# Patient Record
Sex: Female | Born: 1955 | Race: White | Hispanic: No | Marital: Married | Smoking: Never smoker
Health system: Southern US, Community
[De-identification: ages and names within clinical notes are randomized; demographics above are authoritative.]

## PROBLEM LIST (undated history)

## (undated) DIAGNOSIS — M797 Fibromyalgia: Secondary | ICD-10-CM

## (undated) DIAGNOSIS — E119 Type 2 diabetes mellitus without complications: Secondary | ICD-10-CM

## (undated) DIAGNOSIS — G8929 Other chronic pain: Secondary | ICD-10-CM

## (undated) DIAGNOSIS — C50919 Malignant neoplasm of unspecified site of unspecified female breast: Secondary | ICD-10-CM

## (undated) DIAGNOSIS — F419 Anxiety disorder, unspecified: Secondary | ICD-10-CM

## (undated) HISTORY — PX: BLADDER REMOVAL: SHX567

## (undated) HISTORY — PX: MASTECTOMY: SHX3

## (undated) HISTORY — PX: ABDOMINAL HYSTERECTOMY: SHX81

## (undated) HISTORY — PX: APPENDECTOMY: SHX54

---

## 2015-05-19 ENCOUNTER — Encounter: Payer: Self-pay | Admitting: Emergency Medicine

## 2015-05-19 ENCOUNTER — Emergency Department

## 2015-05-19 ENCOUNTER — Emergency Department
Admission: EM | Admit: 2015-05-19 | Discharge: 2015-05-20 | Disposition: A | Discharge status: Home / Self Care | Attending: Emergency Medicine | Admitting: Emergency Medicine

## 2015-05-19 DIAGNOSIS — Y832 Surgical operation with anastomosis, bypass or graft as the cause of abnormal reaction of the patient, or of later complication, without mention of misadventure at the time of the procedure: Secondary | ICD-10-CM | POA: Insufficient documentation

## 2015-05-19 DIAGNOSIS — E160 Drug-induced hypoglycemia without coma: Secondary | ICD-10-CM | POA: Insufficient documentation

## 2015-05-19 DIAGNOSIS — T82898A Other specified complication of vascular prosthetic devices, implants and grafts, initial encounter: Secondary | ICD-10-CM | POA: Diagnosis present

## 2015-05-19 DIAGNOSIS — T383X1A Poisoning by insulin and oral hypoglycemic [antidiabetic] drugs, accidental (unintentional), initial encounter: Secondary | ICD-10-CM

## 2015-05-19 DIAGNOSIS — E11649 Type 2 diabetes mellitus with hypoglycemia without coma: Secondary | ICD-10-CM | POA: Diagnosis not present

## 2015-05-19 DIAGNOSIS — Z79899 Other long term (current) drug therapy: Secondary | ICD-10-CM | POA: Diagnosis not present

## 2015-05-19 DIAGNOSIS — T370X5A Adverse effect of sulfonamides, initial encounter: Secondary | ICD-10-CM | POA: Diagnosis not present

## 2015-05-19 DIAGNOSIS — R404 Transient alteration of awareness: Secondary | ICD-10-CM

## 2015-05-19 HISTORY — DX: Fibromyalgia: M79.7

## 2015-05-19 HISTORY — DX: Other chronic pain: G89.29

## 2015-05-19 HISTORY — DX: Malignant neoplasm of unspecified site of unspecified female breast: C50.919

## 2015-05-19 HISTORY — DX: Type 2 diabetes mellitus without complications: E11.9

## 2015-05-19 HISTORY — DX: Anxiety disorder, unspecified: F41.9

## 2015-05-19 LAB — URINALYSIS COMPLETE WITH MICROSCOPIC (ARMC ONLY)
Bilirubin Urine: NEGATIVE
Glucose, UA: NEGATIVE mg/dL
HGB URINE DIPSTICK: NEGATIVE
KETONES UR: NEGATIVE mg/dL
LEUKOCYTES UA: NEGATIVE
Nitrite: POSITIVE — AB
PH: 7 (ref 5.0–8.0)
Protein, ur: NEGATIVE mg/dL
Specific Gravity, Urine: 1.004 — ABNORMAL LOW (ref 1.005–1.030)
Squamous Epithelial / LPF: NONE SEEN

## 2015-05-19 LAB — COMPREHENSIVE METABOLIC PANEL
ALBUMIN: 4.1 g/dL (ref 3.5–5.0)
ALT: 20 U/L (ref 14–54)
ANION GAP: 7 (ref 5–15)
AST: 26 U/L (ref 15–41)
Alkaline Phosphatase: 65 U/L (ref 38–126)
BUN: 11 mg/dL (ref 6–20)
CALCIUM: 9 mg/dL (ref 8.9–10.3)
CO2: 28 mmol/L (ref 22–32)
Chloride: 102 mmol/L (ref 101–111)
Creatinine, Ser: 0.74 mg/dL (ref 0.44–1.00)
GFR calc non Af Amer: >60 mL/min (ref >60)
GLUCOSE: 53 mg/dL — AB (ref 65–99)
Potassium: 4.3 mmol/L (ref 3.5–5.1)
Sodium: 137 mmol/L (ref 135–145)
Total Bilirubin: 0.6 mg/dL (ref 0.3–1.2)
Total Protein: 7.4 g/dL (ref 6.5–8.1)

## 2015-05-19 LAB — CBC WITH DIFFERENTIAL/PLATELET
BASOS ABS: 0 10*3/uL (ref 0–0.1)
Basophils Relative: 1 %
Eosinophils Absolute: 0.4 10*3/uL (ref 0–0.7)
Eosinophils Relative: 4 %
HCT: 41.6 % (ref 35.0–47.0)
HEMOGLOBIN: 14 g/dL (ref 12.0–16.0)
Lymphocytes Relative: 32 %
Lymphs Abs: 2.9 10*3/uL (ref 1.0–3.6)
MCH: 28.2 pg (ref 26.0–34.0)
MCHC: 33.6 g/dL (ref 32.0–36.0)
MCV: 84.1 fL (ref 80.0–100.0)
Monocytes Absolute: 0.6 10*3/uL (ref 0.2–0.9)
Monocytes Relative: 7 %
Neutro Abs: 5 10*3/uL (ref 1.4–6.5)
Neutrophils Relative %: 56 %
Platelets: 250 10*3/uL (ref 150–440)
RBC: 4.94 MIL/uL (ref 3.80–5.20)
RDW: 14 % (ref 11.5–14.5)
WBC: 8.9 10*3/uL (ref 3.6–11.0)

## 2015-05-19 LAB — AMMONIA

## 2015-05-19 LAB — GLUCOSE, CAPILLARY: Glucose-Capillary: 48 mg/dL — ABNORMAL LOW (ref 65–99)

## 2015-05-19 NOTE — ED Notes (Signed)
Pt presents to ER alert and in NAD. Pt is currently seen by hospice and had her PICC line accidently removed today. During triage family also states they want blood tests since pt has had periods of AMS.

## 2015-05-19 NOTE — ED Notes (Signed)
Pt. Has foley catheter in place.

## 2015-05-19 NOTE — ED Notes (Signed)
MD at bedside. 

## 2015-05-19 NOTE — ED Notes (Signed)
Pt. Is here under hospice care.  Pt. Is on a Dilaudid pump.  Pt. Is a breast CA pt.  Pt. Is here today for altered mental status.  Pt. picc line came out of pt. Lt. Arm.

## 2015-05-19 NOTE — ED Provider Notes (Signed)
[facility] Emergency Department Provider Note  ____________________________________________  Time seen: Approximately 910PM  I have reviewed the triage vital signs and the nursing notes.   HISTORY  Chief Complaint Vascular Access Problem   HPI Nancy Zuniga is a 59 y.o. female with a history of metastatic breast cancer as well as a remote history of bladder cancer, on hospice, who presents with worsening altered mental status over the past 1.5 weeks as well as a pulled PICC line by accident at home today.Over the past 1.5 weeks the patient has been having increased confusion as well as difficulty remembering. Prior to the last week and a half she has been alert and oriented 4 but over the past week and a half she has been more forgetful. The husband says that she has had a decline since a kidney infection this past June. She denies any pain at this time. No nausea vomiting or diarrhea. Able to eat today. No complaints of pain and bleeding from the PICC site. Is using the PICC at home for a Dilaudid infusion secondary to her metastatic cancer. No known brain metastases. However, does have known metastases to the liver. Patient on baseline Macrobid.   Past Medical History  Diagnosis Date  . Breast cancer   . Diabetes mellitus without complication   . Anxiety   . Chronic pain   . Fibromyalgia     There are no active problems to display for this patient.   Past Surgical History  Procedure Laterality Date  . Bladder removal    . Mastectomy    . Appendectomy    . Abdominal hysterectomy      No current outpatient prescriptions on file.  Allergies Ciprofloxacin; Codeine; Iodine; Morphine and related; and Tape  History reviewed. No pertinent family history.  Social History History  Substance Use Topics  . Smoking status: Never Smoker   . Smokeless tobacco: Not on file  . Alcohol Use: No    Review of Systems Constitutional: No  fever/chills Eyes: No visual changes. ENT: No sore throat. Cardiovascular: Denies chest pain. Respiratory: Denies shortness of breath. Gastrointestinal: No abdominal pain.  No nausea, no vomiting.  No diarrhea.  No constipation. Genitourinary: Negative for dysuria. Musculoskeletal: Negative for back pain. Skin: Negative for rash. Neurological: Negative for headaches, focal weakness or numbness.  10-point ROS otherwise negative.  ____________________________________________   PHYSICAL EXAM:  VITAL SIGNS: ED Triage Vitals  Enc Vitals Group     BP 05/19/15 2029 102/56 mmHg     Pulse Rate 05/19/15 2029 90     Resp 05/19/15 2029 20     Temp 05/19/15 2029 98.6 F (37 C)     Temp Source 05/19/15 2029 Oral     SpO2 05/19/15 2029 95 %     Weight 05/19/15 2029 175 lb (79.379 kg)     Height 05/19/15 2029 5\' 2"  (1.575 m)     Head Cir --      Peak Flow --      Pain Score 05/19/15 2030 2     Pain Loc --      Pain Edu? --      Excl. in [place]? --     Constitutional: Alert and oriented. Well appearing and in no acute distress. Eyes: Conjunctivae are normal. PERRL. EOMI. Head: Atraumatic. Nose: No congestion/rhinnorhea. Mouth/Throat: Mucous membranes are moist.  Oropharynx non-erythematous. Neck: No stridor.   Cardiovascular: Normal rate, regular rhythm. Grossly normal heart sounds.  Good peripheral circulation. Respiratory: Normal respiratory  effort.  No retractions. Lungs CTAB. Gastrointestinal: Soft and nontender. No distention. No abdominal bruits. No CVA tenderness. Urostomy bag to the right lower quadrant of the abdomen. Clear yellow urine in the bag. Musculoskeletal: No lower extremity tenderness nor edema.  No joint effusions. Left upper extremity at PICC line site without any erythema, pus or tenderness palpation at site of PICC insertion. Neurologic:  Normal speech and language. No gross focal neurologic deficits are appreciated. No gait instability. Skin:  Skin is warm, dry and  intact. No rash noted. Psychiatric: Mood and affect are normal. Speech and behavior are normal.  ____________________________________________   LABS (all labs ordered are listed, but only abnormal results are displayed)  Labs Reviewed  URINALYSIS COMPLETEWITH MICROSCOPIC ([place]) - Abnormal; Notable for the following:    Color, Urine YELLOW (*)    APPearance CLEAR (*)    Specific Gravity, Urine 1.004 (*)    Nitrite POSITIVE (*)    Bacteria, UA RARE (*)    All other components within normal limits  URINE CULTURE  CBC WITH DIFFERENTIAL/PLATELET  COMPREHENSIVE METABOLIC PANEL  AMMONIA   ____________________________________________  EKG   ____________________________________________  RADIOLOGY No active cardiopulmonary disease on the chest x-ray personally reviewed these images.  ____________________________________________   PROCEDURES    ____________________________________________   INITIAL IMPRESSION / ASSESSMENT AND PLAN / ED COURSE  Pertinent labs & imaging results that were available during my care of the patient were reviewed by me and considered in my medical decision making (see chart for details).  ----------------------------------------- 9:38 PM on 05/19/2015 -----------------------------------------  Discussed with the patient's hospice nurse, Jeannine Kitten, who says that she will check with the hospice house to see if they will be able to take the patient until a new PICC line can be placed after the weekend.  ----------------------------------------- 10:20 PM on 05/19/2015 -----------------------------------------  Patient now saying she does not one the PICC line. Has discussed this with her husband. I discussed this with the patient and her husband saying that this means that they would not be able to have the Dilaudid infusion for pain control. The patient says that she does not think that Dilaudid was helping much with her pain and will continue  with her by mouth methadone. At this time the patient is still pending her lab results. Likely to be dispositioned to home. Signed out to Dr. Jacqualine Code. ____________________________________________   FINAL CLINICAL IMPRESSION(S) / ED DIAGNOSES  Acute altered mental status. Acute removed PICC. Initial visit.    Orbie Pyo, MD 05/19/15 2222

## 2015-05-19 NOTE — ED Notes (Signed)
Patient transported to X-ray 

## 2015-05-19 NOTE — ED Provider Notes (Signed)
Patient's blood sugar noted to be less than 50. This may very well be causing altered mental status. She is currently eating crackers and a sandwich, and when I see her she is awake and alert and mental status is improved.  She is notably on sulfonylurea, and we will admit her for ongoing observation as we continue to manage her hypoglycemia. In addition, hospice has been contacted and is attempting to arrange a bed at the hospice home but has not yet been able to open a bed.  Admitted Hypoglycemia, sulfonylurea  Delman Kitten, MD 05/19/15 [phone]

## 2015-05-20 ENCOUNTER — Encounter: Payer: Self-pay | Admitting: Internal Medicine

## 2015-05-20 LAB — GLUCOSE, CAPILLARY: Glucose-Capillary: 136 mg/dL — ABNORMAL HIGH (ref 65–99)

## 2015-05-20 MED ORDER — HYDROMORPHONE HCL 1 MG/ML IJ SOLN
0.5000 mg | Freq: Once | INTRAMUSCULAR | Status: AC
  Administered 2015-05-20: 0.5 mg via INTRAVENOUS

## 2015-05-20 MED ORDER — HYDROMORPHONE HCL 1 MG/ML IJ SOLN
INTRAMUSCULAR | Status: AC
  Administered 2015-05-20: 0.5 mg via INTRAVENOUS
  Filled 2015-05-20: qty 1

## 2015-05-20 NOTE — ED Notes (Signed)
Pt. Going home with following up with hospice in morning.  Pt. And husband have # to hospice to call tonight if pain gets out of control.

## 2015-05-20 NOTE — Consult Note (Signed)
Reason for Consult: Hypoglycemia Referring Physician: Charolett Bumpers, MD  Nancy Zuniga is an 59 y.o. female.  HPI: The patient presents to the hospital with confusion. Past medical history is significant for stage IV breast cancer and the patient had been under hospice care at home with a Dilaudid drip by PICC line. According to her husband, the patient became agitated and confused at home and removed her PICC line. They were concerned about her access for pain control and presented to the emergency department where she was also found to be hypoglycemic. The patient displayed confusion at some point but she was never unconscious or unresponsive. She did not require D50 infusion. Initially the emergency department staff called for admission secondary to sulfonylurea-induced hypoglycemia but the patient is very reluctant for admission as her priority is to have end-of-life care according to her own wishes.The patient ate a sandwich and had some soda which improved her blood sugar to 136. Notably, the patient took methadone prior to presentation to the emergency department. Confusion was resolved by the time of my interview. She states that she does not want replacement of her PICC line.  Past Medical History  Diagnosis Date  . Breast cancer   . Diabetes mellitus without complication   . Anxiety   . Chronic pain   . Fibromyalgia     Past Surgical History  Procedure Laterality Date  . Bladder removal    . Mastectomy Right   . Appendectomy    . Abdominal hysterectomy      Family History  Problem Relation Age of Onset  . Coronary artery disease    . Diabetes Mellitus II      Social History:  reports that she has never smoked. She does not have any smokeless tobacco history on file. She reports that she does not drink alcohol. Her drug history is not on file.  Allergies:  Allergies  Allergen Reactions  . Ciprofloxacin Hives  . Codeine Other (See Comments)    Reaction: hyperactivity.   . Morphine And Related Other (See Comments)    Reaction: hyperactivity  . Tape Other (See Comments)    Reaction: blisters if left on too long.  . Iodine Rash    Prior to Admission medications   Medication Sig Start Date End Date Taking? Authorizing Provider  clonazePAM (KLONOPIN) 1 MG tablet Take 1 mg by mouth 4 (four) times daily.   Yes Historical Provider, MD  etodolac (LODINE XL) 500 MG 24 hr tablet Take 500 mg by mouth daily.   Yes Historical Provider, MD  glipiZIDE (GLUCOTROL XL) 5 MG 24 hr tablet Take 5 mg by mouth daily.   Yes Historical Provider, MD  hydrOXYzine (ATARAX/VISTARIL) 25 MG tablet Take 50 mg by mouth at bedtime.    Yes Historical Provider, MD  lisinopril (PRINIVIL,ZESTRIL) 5 MG tablet Take 5 mg by mouth daily.   Yes Historical Provider, MD  LORazepam (ATIVAN) 1 MG tablet Take 1 mg by mouth every 6 (six) hours as needed for anxiety.   Yes Historical Provider, MD  metFORMIN (GLUCOPHAGE) 500 MG tablet Take 1,000 mg by mouth 2 (two) times daily with a meal.   Yes Historical Provider, MD  methadone (DOLOPHINE) 10 MG tablet Take 15 mg by mouth every 8 (eight) hours.   Yes Historical Provider, MD  nitrofurantoin (MACRODANTIN) 50 MG capsule Take 50 mg by mouth every evening.   Yes Historical Provider, MD  pantoprazole (PROTONIX) 20 MG tablet Take 20 mg by mouth every morning.  Yes Historical Provider, MD  pregabalin (LYRICA) 100 MG capsule Take 200 mg by mouth 3 (three) times daily.   Yes Historical Provider, MD  venlafaxine XR (EFFEXOR-XR) 150 MG 24 hr capsule Take 150 mg by mouth 2 (two) times daily.   Yes Historical Provider, MD     Results for orders placed or performed during the hospital encounter of 05/19/15 (from the past 48 hour(s))  CBC with Differential     Status: None   Collection Time: 05/19/15  9:16 PM  Result Value Ref Range   WBC 8.9 3.6 - 11.0 K/uL   RBC 4.94 3.80 - 5.20 MIL/uL   Hemoglobin 14.0 12.0 - 16.0 g/dL   HCT 41.6 35.0 - 47.0 %   MCV 84.1  80.0 - 100.0 fL   MCH 28.2 26.0 - 34.0 pg   MCHC 33.6 32.0 - 36.0 g/dL   RDW 14.0 11.5 - 14.5 %   Platelets 250 150 - 440 K/uL    Comment: COUNT MAY BE INACCURATE DUE TO FIBRIN CLUMPS.   Neutrophils Relative % 56% %   Neutro Abs 5.0 1.4 - 6.5 K/uL   Lymphocytes Relative 32% %   Lymphs Abs 2.9 1.0 - 3.6 K/uL   Monocytes Relative 7% %   Monocytes Absolute 0.6 0.2 - 0.9 K/uL   Eosinophils Relative 4% %   Eosinophils Absolute 0.4 0 - 0.7 K/uL   Basophils Relative 1% %   Basophils Absolute 0.0 0 - 0.1 K/uL  Comprehensive metabolic panel     Status: Abnormal   Collection Time: 05/19/15  9:16 PM  Result Value Ref Range   Sodium 137 135 - 145 mmol/L   Potassium 4.3 3.5 - 5.1 mmol/L   Chloride 102 101 - 111 mmol/L   CO2 28 22 - 32 mmol/L   Glucose, Bld 53 (L) 65 - 99 mg/dL   BUN 11 6 - 20 mg/dL   Creatinine, Ser 0.74 0.44 - 1.00 mg/dL   Calcium 9.0 8.9 - 10.3 mg/dL   Total Protein 7.4 6.5 - 8.1 g/dL   Albumin 4.1 3.5 - 5.0 g/dL   AST 26 15 - 41 U/L   ALT 20 14 - 54 U/L   Alkaline Phosphatase 65 38 - 126 U/L   Total Bilirubin 0.6 0.3 - 1.2 mg/dL   GFR calc non Af Amer >60 >60 mL/min   GFR calc Af Amer >60 >60 mL/min    Comment: (NOTE) The eGFR has been calculated using the CKD EPI equation. This calculation has not been validated in all clinical situations. eGFR's persistently <60 mL/min signify possible Chronic Kidney Disease.    Anion gap 7 5 - 15  Ammonia     Status: Abnormal   Collection Time: 05/19/15  9:16 PM  Result Value Ref Range   Ammonia <9 (L) 9 - 35 umol/L  Urinalysis complete, with microscopic ([facility] only)     Status: Abnormal   Collection Time: 05/19/15  9:16 PM  Result Value Ref Range   Color, Urine YELLOW (A) YELLOW   APPearance CLEAR (A) CLEAR   Glucose, UA NEGATIVE NEGATIVE mg/dL   Bilirubin Urine NEGATIVE NEGATIVE   Ketones, ur NEGATIVE NEGATIVE mg/dL   Specific Gravity, Urine 1.004 (L) 1.005 - 1.030   Hgb urine dipstick NEGATIVE NEGATIVE   pH 7.0 5.0  - 8.0   Protein, ur NEGATIVE NEGATIVE mg/dL   Nitrite POSITIVE (A) NEGATIVE   Leukocytes, UA NEGATIVE NEGATIVE   RBC / HPF 0-5 0 - 5 RBC/hpf  WBC, UA 0-5 0 - 5 WBC/hpf   Bacteria, UA RARE (A) NONE SEEN   Squamous Epithelial / LPF NONE SEEN NONE SEEN   Mucous PRESENT   Glucose, capillary     Status: Abnormal   Collection Time: 05/19/15 11:30 PM  Result Value Ref Range   Glucose-Capillary 48 (L) 65 - 99 mg/dL    Dg Chest 2 View  05/19/2015   CLINICAL DATA:  Altered mental status  EXAM: CHEST  2 VIEW  COMPARISON:  None.  FINDINGS: The heart size and mediastinal contours are within normal limits. Both lungs are clear. The visualized skeletal structures are unremarkable.  IMPRESSION: No active cardiopulmonary disease.   Electronically Signed   By: Andreas Newport M.D.   On: 05/19/2015 21:42    Review of Systems  Constitutional: Negative for fever and chills.  HENT: Negative for sore throat and tinnitus.   Eyes: Negative for blurred vision and redness.  Respiratory: Negative for cough and shortness of breath.   Cardiovascular: Negative for chest pain, palpitations, orthopnea and PND.  Gastrointestinal: Negative for nausea, vomiting, abdominal pain and diarrhea.  Genitourinary: Negative for dysuria, urgency and frequency.  Musculoskeletal: Negative for myalgias and joint pain.  Skin: Negative for rash.       No lesions  Neurological: Negative for speech change, focal weakness and weakness.  Endo/Heme/Allergies: Does not bruise/bleed easily.       No temperature intolerance  Psychiatric/Behavioral: Negative for depression and suicidal ideas.   Blood pressure 134/101, pulse 115, temperature 98.6 F (37 C), temperature source Oral, resp. rate 18, height _0  (1.575 m), weight 79.379 kg (175 lb), SpO2 96 %. Physical Exam  Nursing note and vitals reviewed. Constitutional: She is oriented to person, place, and time. She appears well-developed and well-nourished.  HENT:  Head:  Normocephalic and atraumatic.  Mouth/Throat: Oropharynx is clear and moist.  Eyes: Conjunctivae and EOM are normal. Pupils are equal, round, and reactive to light. No scleral icterus.  Neck: Normal range of motion. No tracheal deviation present. No thyromegaly present.  Cardiovascular: Normal rate, regular rhythm and normal heart sounds.  Exam reveals no gallop and no friction rub.   No murmur heard. Respiratory: Effort normal and breath sounds normal.  Status post right mastectomy  GI: Soft. Bowel sounds are normal. She exhibits no distension. There is no tenderness.  Cystostomy bag in place  Genitourinary:  Deferred  Musculoskeletal: Normal range of motion. She exhibits no edema.  Lymphadenopathy:    She has no cervical adenopathy.  Neurological: She is alert and oriented to person, place, and time. No cranial nerve deficit. She exhibits normal muscle tone.  Skin: Skin is warm and dry.  Psychiatric: She has a normal mood and affect. Her behavior is normal. Judgment and thought content normal.    Assessment/Plan: This is a 60 year old Caucasian female resolved hypoglycemia and metastatic breast cancer who is on hospice care and wishes to spend her last days at home or understanding the supportive care of a hospice facility. 1. Hypoglycemia: Clear to sulfonylurea induced. Without oral hypoglycemics the patient's blood sugar runs approximately 200 according to her husband. At this stage of care management of her diabetes is the least of her concerns. She is clear that if she feels hypoglycemic again she will simply eat and intake carbohydrates. Also she is completely aware that hypoglycemia may be a cause of death but that death is imminent from her breast cancer anyway. I agree with this statement and would like to  respect the patient's end-of-life wishes. I have encouraged her to hold her glipizide in the future. 2. Pain control: The patient has received a dose of Dilaudid in the emergency  department. She also has methadone tablets at home and will discuss in the morning with her hospice nurse fast acting narcotic medication for breakthrough pain. She has made it clear that she would not like replacement of her PICC line. 3. Anxiety: The patient has Ativan and Atarax at home for her symptomatically relief. 4. Disposition: The patient will be discharged from the emergency department in the care of her husband was very clear on the repercussions of hypoglycemia and the end stages of metastatic cancer. Party contacted her hospice nurse and they will continue end-of-life care at home. The patient is a DO NOT RESUSCITATE. Time spent on consultation and physical exam as well as direct patient care approximately 45 minutes  Harrie Foreman 05/20/2015, 1:04 AM

## 2015-05-20 NOTE — Discharge Instructions (Signed)
Please seek medical attention for any high fevers, chest pain, shortness of breath, change in behavior, persistent vomiting, bloody stool or any other new or concerning symptoms. Hypoglycemia Hypoglycemia occurs when the glucose in your blood is too low. Glucose is a type of sugar that is your body's main energy source. Hormones, such as insulin and glucagon, control the level of glucose in the blood. Insulin lowers blood glucose and glucagon increases blood glucose. Having too much insulin in your blood stream, or not eating enough food containing sugar, can result in hypoglycemia. Hypoglycemia can happen to people with or without diabetes. It can develop quickly and can be a medical emergency.  CAUSES   Missing or delaying meals.  Not eating enough carbohydrates at meals.  Taking too much diabetes medicine.  Not timing your oral diabetes medicine or insulin doses with meals, snacks, and exercise.  Nausea and vomiting.  Certain medicines.  Severe illnesses, such as hepatitis, kidney disorders, and certain eating disorders.  Increased activity or exercise without eating something extra or adjusting medicines.  Drinking too much alcohol.  A nerve disorder that affects body functions like your heart rate, blood pressure, and digestion (autonomic neuropathy).  A condition where the stomach muscles do not function properly (gastroparesis). Therefore, medicines and food may not absorb properly.  Rarely, a tumor of the pancreas can produce too much insulin. SYMPTOMS   Hunger.  Sweating (diaphoresis).  Change in body temperature.  Shakiness.  Headache.  Anxiety.  Lightheadedness.  Irritability.  Difficulty concentrating.  Dry mouth.  Tingling or numbness in the hands or feet.  Restless sleep or sleep disturbances.  Altered speech and coordination.  Change in mental status.  Seizures or prolonged convulsions.  Combativeness.  Drowsiness  (lethargic).  Weakness.  Increased heart rate or palpitations.  Confusion.  Pale, gray skin color.  Blurred or double vision.  Fainting. DIAGNOSIS  A physical exam and medical history will be performed. Your caregiver may make a diagnosis based on your symptoms. Blood tests and other lab tests may be performed to confirm a diagnosis. Once the diagnosis is made, your caregiver will see if your signs and symptoms go away once your blood glucose is raised.  TREATMENT  Usually, you can easily treat your hypoglycemia when you notice symptoms.  Check your blood glucose. If it is less than 70 mg/dl, take one of the following:   3-4 glucose tablets.    cup juice.    cup regular soda.   1 cup skim milk.   -1 tube of glucose gel.   5-6 hard candies.   Avoid high-fat drinks or food that may delay a rise in blood glucose levels.  Do not take more than the recommended amount of sugary foods, drinks, gel, or tablets. Doing so will cause your blood glucose to go too high.   Wait 10-15 minutes and recheck your blood glucose. If it is still less than 70 mg/dl or below your target range, repeat treatment.   Eat a snack if it is more than 1 hour until your next meal.  There may be a time when your blood glucose may go so low that you are unable to treat yourself at home when you start to notice symptoms. You may need someone to help you. You may even faint or be unable to swallow. If you cannot treat yourself, someone will need to bring you to the hospital.  [place]  If you have diabetes, follow your diabetes management plan by:  Taking your medicines as directed.  Following your exercise plan.  Following your meal plan. Do not skip meals. Eat on time.  Testing your blood glucose regularly. Check your blood glucose before and after exercise. If you exercise longer or different than usual, be sure to check blood glucose more frequently.  Wearing your  medical alert jewelry that says you have diabetes.  Identify the cause of your hypoglycemia. Then, develop ways to prevent the recurrence of hypoglycemia.  Do not take a hot bath or shower right after an insulin shot.  Always carry treatment with you. Glucose tablets are the easiest to carry.  If you are going to drink alcohol, drink it only with meals.  Tell friends or family members ways to keep you safe during a seizure. This may include removing hard or sharp objects from the area or turning you on your side.  Maintain a healthy weight. SEEK MEDICAL CARE IF:   You are having problems keeping your blood glucose in your target range.  You are having frequent episodes of hypoglycemia.  You feel you might be having side effects from your medicines.  You are not sure why your blood glucose is dropping so low.  You notice a change in vision or a new problem with your vision. SEEK IMMEDIATE MEDICAL CARE IF:   Confusion develops.  A change in mental status occurs.  The inability to swallow develops.  Fainting occurs. Document Released: 10/06/2005 Document Revised: 10/11/2013 Document Reviewed: 02/02/2012 [facility] Patient Information 2015 [place], [place]. This information is not intended to replace advice given to you by your health care provider. Make sure you discuss any questions you have with your health care provider.

## 2015-05-20 NOTE — ED Notes (Signed)
BS 136

## 2015-05-21 LAB — URINE CULTURE

## 2015-12-06 ENCOUNTER — Ambulatory Visit: Admitting: Pain Medicine

## 2015-12-06 ENCOUNTER — Telehealth: Payer: Self-pay | Admitting: Pain Medicine

## 2015-12-06 NOTE — Telephone Encounter (Signed)
Husband called2-15-17 at 4:42, patient is in [facility] hospital and is unable to come to appt  They will resched

## 2015-12-06 NOTE — Telephone Encounter (Signed)
Thank you :)

## 2015-12-10 ENCOUNTER — Inpatient Hospital Stay
Admission: EM | Admit: 2015-12-10 | Discharge: 2015-12-12 | DRG: 871 | Disposition: A | Discharge status: Hospice | Attending: Internal Medicine | Admitting: Internal Medicine

## 2015-12-10 ENCOUNTER — Encounter: Payer: Self-pay | Admitting: Student

## 2015-12-10 ENCOUNTER — Emergency Department

## 2015-12-10 DIAGNOSIS — Z853 Personal history of malignant neoplasm of breast: Secondary | ICD-10-CM | POA: Diagnosis not present

## 2015-12-10 DIAGNOSIS — F419 Anxiety disorder, unspecified: Secondary | ICD-10-CM | POA: Diagnosis not present

## 2015-12-10 DIAGNOSIS — I10 Essential (primary) hypertension: Secondary | ICD-10-CM | POA: Diagnosis not present

## 2015-12-10 DIAGNOSIS — C787 Secondary malignant neoplasm of liver and intrahepatic bile duct: Secondary | ICD-10-CM | POA: Diagnosis not present

## 2015-12-10 DIAGNOSIS — Z936 Other artificial openings of urinary tract status: Secondary | ICD-10-CM

## 2015-12-10 DIAGNOSIS — Z7984 Long term (current) use of oral hypoglycemic drugs: Secondary | ICD-10-CM

## 2015-12-10 DIAGNOSIS — Z515 Encounter for palliative care: Secondary | ICD-10-CM | POA: Diagnosis not present

## 2015-12-10 DIAGNOSIS — G8929 Other chronic pain: Secondary | ICD-10-CM | POA: Diagnosis not present

## 2015-12-10 DIAGNOSIS — M797 Fibromyalgia: Secondary | ICD-10-CM | POA: Diagnosis not present

## 2015-12-10 DIAGNOSIS — J189 Pneumonia, unspecified organism: Secondary | ICD-10-CM | POA: Diagnosis present

## 2015-12-10 DIAGNOSIS — Z66 Do not resuscitate: Secondary | ICD-10-CM | POA: Diagnosis present

## 2015-12-10 DIAGNOSIS — Z8551 Personal history of malignant neoplasm of bladder: Secondary | ICD-10-CM | POA: Diagnosis not present

## 2015-12-10 DIAGNOSIS — F319 Bipolar disorder, unspecified: Secondary | ICD-10-CM | POA: Diagnosis not present

## 2015-12-10 DIAGNOSIS — G9340 Encephalopathy, unspecified: Secondary | ICD-10-CM | POA: Diagnosis present

## 2015-12-10 DIAGNOSIS — R233 Spontaneous ecchymoses: Secondary | ICD-10-CM | POA: Diagnosis not present

## 2015-12-10 DIAGNOSIS — J9690 Respiratory failure, unspecified, unspecified whether with hypoxia or hypercapnia: Secondary | ICD-10-CM | POA: Diagnosis present

## 2015-12-10 DIAGNOSIS — R410 Disorientation, unspecified: Secondary | ICD-10-CM | POA: Diagnosis present

## 2015-12-10 DIAGNOSIS — E119 Type 2 diabetes mellitus without complications: Secondary | ICD-10-CM | POA: Diagnosis present

## 2015-12-10 DIAGNOSIS — A419 Sepsis, unspecified organism: Principal | ICD-10-CM | POA: Diagnosis present

## 2015-12-10 DIAGNOSIS — J9601 Acute respiratory failure with hypoxia: Secondary | ICD-10-CM | POA: Diagnosis not present

## 2015-12-10 DIAGNOSIS — Z8659 Personal history of other mental and behavioral disorders: Secondary | ICD-10-CM

## 2015-12-10 LAB — COMPREHENSIVE METABOLIC PANEL
ALK PHOS: 76 U/L (ref 38–126)
ALT: 66 U/L — AB (ref 14–54)
AST: 158 U/L — AB (ref 15–41)
Albumin: 4.6 g/dL (ref 3.5–5.0)
Anion gap: 17 — ABNORMAL HIGH (ref 5–15)
BUN: 37 mg/dL — AB (ref 6–20)
CALCIUM: 9.9 mg/dL (ref 8.9–10.3)
CHLORIDE: 111 mmol/L (ref 101–111)
CO2: 18 mmol/L — ABNORMAL LOW (ref 22–32)
Creatinine, Ser: 1.11 mg/dL — ABNORMAL HIGH (ref 0.44–1.00)
GFR, EST NON AFRICAN AMERICAN: 54 mL/min — AB (ref >60)
Glucose, Bld: 288 mg/dL — ABNORMAL HIGH (ref 65–99)
Potassium: 4 mmol/L (ref 3.5–5.1)
Sodium: 146 mmol/L — ABNORMAL HIGH (ref 135–145)
Total Bilirubin: 0.9 mg/dL (ref 0.3–1.2)
Total Protein: 8.2 g/dL — ABNORMAL HIGH (ref 6.5–8.1)

## 2015-12-10 LAB — URINALYSIS COMPLETE WITH MICROSCOPIC (ARMC ONLY)
BILIRUBIN URINE: NEGATIVE
Glucose, UA: >500 mg/dL — AB
Leukocytes, UA: NEGATIVE
Nitrite: NEGATIVE
PH: 6 (ref 5.0–8.0)
Protein, ur: 100 mg/dL — AB
Specific Gravity, Urine: 1.01 (ref 1.005–1.030)

## 2015-12-10 LAB — GLUCOSE, CAPILLARY
GLUCOSE-CAPILLARY: 288 mg/dL — AB (ref 65–99)
GLUCOSE-CAPILLARY: 308 mg/dL — AB (ref 65–99)
Glucose-Capillary: 223 mg/dL — ABNORMAL HIGH (ref 65–99)
Glucose-Capillary: 347 mg/dL — ABNORMAL HIGH (ref 65–99)

## 2015-12-10 LAB — CREATININE, SERUM
CREATININE: 1.27 mg/dL — AB (ref 0.44–1.00)
GFR, EST AFRICAN AMERICAN: 53 mL/min — AB (ref >60)
GFR, EST NON AFRICAN AMERICAN: 46 mL/min — AB (ref >60)

## 2015-12-10 LAB — CBC
HCT: 38.1 % (ref 35.0–47.0)
HCT: 45.3 % (ref 35.0–47.0)
HEMOGLOBIN: 12.9 g/dL (ref 12.0–16.0)
HEMOGLOBIN: 15 g/dL (ref 12.0–16.0)
MCH: 28.6 pg (ref 26.0–34.0)
MCH: 28.4 pg (ref 26.0–34.0)
MCHC: 34 g/dL (ref 32.0–36.0)
MCHC: 33.2 g/dL (ref 32.0–36.0)
MCV: 84.1 fL (ref 80.0–100.0)
MCV: 85.4 fL (ref 80.0–100.0)
PLATELETS: 390 10*3/uL (ref 150–440)
PLATELETS: 430 10*3/uL (ref 150–440)
RBC: 4.53 MIL/uL (ref 3.80–5.20)
RBC: 5.3 MIL/uL — AB (ref 3.80–5.20)
RDW: 14.2 % (ref 11.5–14.5)
RDW: 14 % (ref 11.5–14.5)
WBC: 21.2 10*3/uL — AB (ref 3.6–11.0)
WBC: 29.5 10*3/uL — AB (ref 3.6–11.0)

## 2015-12-10 LAB — RAPID HIV SCREEN (HIV 1/2 AB+AG)
HIV 1/2 Antibodies: NONREACTIVE
HIV-1 P24 Antigen - HIV24: NONREACTIVE

## 2015-12-10 LAB — RAPID INFLUENZA A&B ANTIGENS: Influenza A (ARMC): NEGATIVE

## 2015-12-10 LAB — TSH: TSH: 0.711 u[IU]/mL (ref 0.350–4.500)

## 2015-12-10 LAB — LACTIC ACID, PLASMA
LACTIC ACID, VENOUS: 3.4 mmol/L — AB (ref 0.5–2.0)
Lactic Acid, Venous: 1.5 mmol/L (ref 0.5–2.0)

## 2015-12-10 LAB — RAPID INFLUENZA A&B ANTIGENS (ARMC ONLY): INFLUENZA B (ARMC): NEGATIVE

## 2015-12-10 LAB — AMMONIA
AMMONIA: 31 umol/L (ref 9–35)
Ammonia: 37 umol/L — ABNORMAL HIGH (ref 9–35)

## 2015-12-10 MED ORDER — ENOXAPARIN SODIUM 40 MG/0.4ML ~~LOC~~ SOLN
40.0000 mg | SUBCUTANEOUS | Status: DC
  Administered 2015-12-11: 40 mg via SUBCUTANEOUS
  Filled 2015-12-10: qty 0.4

## 2015-12-10 MED ORDER — LORAZEPAM 2 MG/ML IJ SOLN
1.0000 mg | Freq: Once | INTRAMUSCULAR | Status: AC
  Administered 2015-12-10: 1 mg via INTRAVENOUS

## 2015-12-10 MED ORDER — HYDRALAZINE HCL 20 MG/ML IJ SOLN
10.0000 mg | Freq: Four times a day (QID) | INTRAMUSCULAR | Status: DC | PRN
  Filled 2015-12-10: qty 1

## 2015-12-10 MED ORDER — SODIUM CHLORIDE 0.9 % IV BOLUS (SEPSIS)
1000.0000 mL | Freq: Once | INTRAVENOUS | Status: AC
  Administered 2015-12-10: 1000 mL via INTRAVENOUS

## 2015-12-10 MED ORDER — LISINOPRIL 5 MG PO TABS: 5.0000 mg | ORAL_TABLET | Freq: Every day | ORAL | Status: DC

## 2015-12-10 MED ORDER — FUROSEMIDE 10 MG/ML IJ SOLN: 40.0000 mg | Freq: Once | INTRAMUSCULAR | Status: DC

## 2015-12-10 MED ORDER — METOPROLOL TARTRATE 25 MG PO TABS: 25.0000 mg | ORAL_TABLET | Freq: Every day | ORAL | Status: DC

## 2015-12-10 MED ORDER — LORAZEPAM 2 MG/ML IJ SOLN: 0.5000 mg | Freq: Once | INTRAMUSCULAR | Status: DC

## 2015-12-10 MED ORDER — IPRATROPIUM-ALBUTEROL 0.5-2.5 (3) MG/3ML IN SOLN
RESPIRATORY_TRACT | Status: AC
  Administered 2015-12-10: 3 mL via RESPIRATORY_TRACT
  Filled 2015-12-10: qty 3

## 2015-12-10 MED ORDER — VANCOMYCIN HCL IN DEXTROSE 1-5 GM/200ML-% IV SOLN
1000.0000 mg | Freq: Once | INTRAVENOUS | Status: AC
  Administered 2015-12-10: 1000 mg via INTRAVENOUS
  Filled 2015-12-10: qty 200

## 2015-12-10 MED ORDER — LABETALOL HCL 5 MG/ML IV SOLN
10.0000 mg | INTRAVENOUS | Status: DC | PRN
  Administered 2015-12-10 – 2015-12-12 (×5): 10 mg via INTRAVENOUS
  Filled 2015-12-10 (×3): qty 4

## 2015-12-10 MED ORDER — PIPERACILLIN-TAZOBACTAM 3.375 G IVPB
3.3750 g | Freq: Three times a day (TID) | INTRAVENOUS | Status: DC
  Administered 2015-12-10 – 2015-12-12 (×5): 3.375 g via INTRAVENOUS
  Filled 2015-12-10 (×7): qty 50

## 2015-12-10 MED ORDER — ONDANSETRON HCL 4 MG PO TABS: 4.0000 mg | ORAL_TABLET | Freq: Three times a day (TID) | ORAL | Status: DC

## 2015-12-10 MED ORDER — FENTANYL CITRATE (PF) 100 MCG/2ML IJ SOLN
50.0000 ug | Freq: Once | INTRAMUSCULAR | Status: AC
  Administered 2015-12-10: 50 ug via INTRAVENOUS
  Filled 2015-12-10: qty 2

## 2015-12-10 MED ORDER — LORAZEPAM 2 MG/ML IJ SOLN
INTRAMUSCULAR | Status: AC
  Administered 2015-12-10: 1 mg via INTRAVENOUS
  Filled 2015-12-10: qty 1

## 2015-12-10 MED ORDER — CETYLPYRIDINIUM CHLORIDE 0.05 % MT LIQD
7.0000 mL | Freq: Two times a day (BID) | OROMUCOSAL | Status: DC
  Administered 2015-12-12: 7 mL via OROMUCOSAL

## 2015-12-10 MED ORDER — INSULIN ASPART 100 UNIT/ML ~~LOC~~ SOLN
0.0000 [IU] | SUBCUTANEOUS | Status: DC
  Administered 2015-12-10: 3 [IU] via SUBCUTANEOUS
  Administered 2015-12-10: 5 [IU] via SUBCUTANEOUS
  Administered 2015-12-10: 7 [IU] via SUBCUTANEOUS
  Administered 2015-12-11: 3 [IU] via SUBCUTANEOUS
  Administered 2015-12-11: 5 [IU] via SUBCUTANEOUS
  Administered 2015-12-11: 3 [IU] via SUBCUTANEOUS
  Administered 2015-12-11: 5 [IU] via SUBCUTANEOUS
  Administered 2015-12-12 (×2): 7 [IU] via SUBCUTANEOUS
  Administered 2015-12-12 (×2): 5 [IU] via SUBCUTANEOUS
  Filled 2015-12-10: qty 5
  Filled 2015-12-10: qty 7
  Filled 2015-12-10 (×2): qty 3
  Filled 2015-12-10: qty 7
  Filled 2015-12-10 (×2): qty 5
  Filled 2015-12-10: qty 3
  Filled 2015-12-10: qty 7
  Filled 2015-12-10 (×2): qty 5

## 2015-12-10 MED ORDER — FUROSEMIDE 10 MG/ML IJ SOLN
40.0000 mg | Freq: Once | INTRAMUSCULAR | Status: AC
  Administered 2015-12-10: 40 mg via INTRAVENOUS
  Filled 2015-12-10: qty 4

## 2015-12-10 MED ORDER — PANTOPRAZOLE SODIUM 20 MG PO TBEC
20.0000 mg | DELAYED_RELEASE_TABLET | ORAL | Status: DC
Start: 2015-12-11 — End: 2015-12-10
  Filled 2015-12-10: qty 1

## 2015-12-10 MED ORDER — CLONAZEPAM 0.5 MG PO TABS: 1.0000 mg | ORAL_TABLET | Freq: Three times a day (TID) | ORAL | Status: DC

## 2015-12-10 MED ORDER — METHADONE HCL 10 MG PO TABS: 20.0000 mg | ORAL_TABLET | Freq: Four times a day (QID) | ORAL | Status: DC

## 2015-12-10 MED ORDER — IPRATROPIUM-ALBUTEROL 0.5-2.5 (3) MG/3ML IN SOLN
3.0000 mL | Freq: Once | RESPIRATORY_TRACT | Status: AC
  Administered 2015-12-10: 3 mL via RESPIRATORY_TRACT

## 2015-12-10 MED ORDER — DEXTROSE 5 % IV SOLN
10.0000 mg/kg | Freq: Three times a day (TID) | INTRAVENOUS | Status: DC
  Administered 2015-12-10 – 2015-12-12 (×6): 570 mg via INTRAVENOUS
  Filled 2015-12-10 (×9): qty 11.4

## 2015-12-10 MED ORDER — SENNOSIDES-DOCUSATE SODIUM 8.6-50 MG PO TABS: 1.0000 | ORAL_TABLET | Freq: Two times a day (BID) | ORAL | Status: DC

## 2015-12-10 MED ORDER — PANTOPRAZOLE SODIUM 40 MG PO TBEC: 40.0000 mg | DELAYED_RELEASE_TABLET | ORAL | Status: DC

## 2015-12-10 MED ORDER — IBUPROFEN 600 MG PO TABS
600.0000 mg | ORAL_TABLET | Freq: Once | ORAL | Status: AC
  Administered 2015-12-10: 600 mg via ORAL
  Filled 2015-12-10: qty 1

## 2015-12-10 MED ORDER — CHLORHEXIDINE GLUCONATE 0.12 % MT SOLN
15.0000 mL | Freq: Two times a day (BID) | OROMUCOSAL | Status: DC
  Administered 2015-12-10 – 2015-12-12 (×3): 15 mL via OROMUCOSAL
  Filled 2015-12-10 (×3): qty 15

## 2015-12-10 MED ORDER — ACETAMINOPHEN 650 MG RE SUPP
650.0000 mg | RECTAL | Status: DC | PRN
Start: 2015-12-10 — End: 2015-12-12
  Administered 2015-12-10 – 2015-12-11 (×4): 650 mg via RECTAL
  Filled 2015-12-10 (×4): qty 1

## 2015-12-10 MED ORDER — PIPERACILLIN-TAZOBACTAM 3.375 G IVPB
3.3750 g | Freq: Once | INTRAVENOUS | Status: AC
  Administered 2015-12-10: 3.375 g via INTRAVENOUS
  Filled 2015-12-10: qty 50

## 2015-12-10 MED ORDER — VANCOMYCIN HCL IN DEXTROSE 750-5 MG/150ML-% IV SOLN
750.0000 mg | Freq: Two times a day (BID) | INTRAVENOUS | Status: DC
  Administered 2015-12-10 – 2015-12-12 (×4): 750 mg via INTRAVENOUS
  Filled 2015-12-10 (×5): qty 150

## 2015-12-10 MED ORDER — VENLAFAXINE HCL ER 75 MG PO CP24: 150.0000 mg | ORAL_CAPSULE | Freq: Two times a day (BID) | ORAL | Status: DC

## 2015-12-10 MED ORDER — SODIUM CHLORIDE 0.9% FLUSH
3.0000 mL | Freq: Two times a day (BID) | INTRAVENOUS | Status: DC
  Administered 2015-12-10 – 2015-12-12 (×4): 3 mL via INTRAVENOUS

## 2015-12-10 MED ORDER — LACTULOSE 10 GM/15ML PO SOLN: 20.0000 g | ORAL | Status: DC

## 2015-12-10 NOTE — ED Notes (Signed)
Patient placed on non rebreather, stating 78%. Respiratory at bedside to put patient on bipap.

## 2015-12-10 NOTE — Progress Notes (Addendum)
eLink Physician-Brief Progress Note Patient Name: Nancy Zuniga DOB: [birth year] MRN: [id number]   Date of Service  12/10/2015  HPI/Events of Note  60 yo female with PMH of Breast CA with liver mets, bladder CA with urostomy, DM and Chronic pain. Presents with ALOC and Tachycardia. Dx: Sepsis - etiology unclear. UA is not impressive and CXR is c/w pulmonary congestion. Head CT Scan is negative. Current medical regimen includes Vancomycin, Zosyn, Lovenox [place] and Tylenol PRN. Note that the patient is a DNR. Temp = 99.2 F, BP = 155/116 (?), HR = 142 (Sinus Tachycardia). Sat = 100% and RR =31. Blood glucose = 347. Management per Hospitalist Service.   eICU Interventions  Will order: 1. Fentanyl 50 mcg IV X 1 now. (Possible Opiod withdrawal) 2. 0.9 NaCl 1 liter IV over 1 hour now.  3. Accuchecks Q 4 hours with sensitive Novolog SSI coverage.      Intervention Category Evaluation Type: New Patient Evaluation  Lysle Dingwall 12/10/2015, 4:16 PM

## 2015-12-10 NOTE — Consult Note (Signed)
Reason for Consult:Altered mental status Referring Physician: Mody  CC: Altered mental status  HPI: Nancy Zuniga is an 60 y.o. female who is unable to provide any history.  Family not available.  All history obtained from the chart.  Patient with a known history of breast cancer with metastatic disease to the liver who has elected for no systemic therapy, diabetes and chronic pain who presents with altered mental status. Patient has had alteration of mental status since November. She was seen at [facility] for altered mental status and diagnosed with a urinary tract infection although her cultures were negative. She was discharged from the hospital on February 16. Her husband brings into the hospital today for worsening altered mental status with hallucinations, frequent falls and wandering. Patient has a urostomy bag upon arrival. Patient was noted to have fever, tachycardia and is very confused. [name]   Past Medical History  Diagnosis Date  . Breast cancer ([place])   . Diabetes mellitus without complication ([place])   . Anxiety   . Chronic pain   . Fibromyalgia     Past Surgical History  Procedure Laterality Date  . Bladder removal    . Mastectomy Right   . Appendectomy    . Abdominal hysterectomy      Family History  Problem Relation Age of Onset  . Coronary artery disease    . Diabetes Mellitus II      Social History:  reports that she has never smoked. She does not have any smokeless tobacco history on file. She reports that she does not drink alcohol. Her drug history is not on file.  Allergies  Allergen Reactions  . Ciprofloxacin Hives  . Codeine Other (See Comments)    Reaction: hyperactivity.  . Morphine And Related Other (See Comments)    Reaction: hyperactivity  . Tape Other (See Comments)    Reaction: blisters if left on too long.  . Iodine Rash     Medications:  I have reviewed the patient's current medications. Prior to Admission:  Prescriptions prior to admission  Medication Sig Dispense Refill Last Dose  . clonazePAM (KLONOPIN) 1 MG tablet Take 1 mg by mouth every 8 (eight) hours.    unknown at unknown  . hydrOXYzine (ATARAX/VISTARIL) 25 MG tablet Take 50 mg by mouth at bedtime.    unknown at unknown  . lactulose (CHRONULAC) 10 GM/15ML solution Take 20 g by mouth every 2 (two) hours.   unknown at unknown  . lisinopril (PRINIVIL,ZESTRIL) 5 MG tablet Take 5 mg by mouth daily.   unknown at unknown  . LORazepam (ATIVAN) 1 MG tablet Take 1 mg by mouth every 6 (six) hours as needed for anxiety.   prn at prn  . methadone (DOLOPHINE) 10 MG tablet Take 20 mg by mouth every 6 (six) hours.    unknown at unknown  . metoprolol tartrate (LOPRESSOR) 25 MG tablet Take 25 mg by mouth daily.   unknown at unknown  . naproxen (NAPROSYN) 500 MG tablet Take 500 mg by mouth 2 (two) times daily with a meal.   unknown at unknown  . nitrofurantoin (MACRODANTIN) 50 MG capsule Take 50 mg by mouth daily.   unknown at unknown  . ondansetron (ZOFRAN) 4 MG tablet Take 4 mg by mouth every 8 (eight) hours.   unknown at unknown  . oxyCODONE (OXY IR/ROXICODONE) 5 MG immediate release tablet Take  5 mg by mouth every 6 (six) hours as needed for moderate pain, severe pain or breakthrough pain.   prn at prn  . pantoprazole (PROTONIX) 20 MG tablet Take 20 mg by mouth every morning.    unknown at unknown  . pregabalin (LYRICA) 100 MG capsule Take 200 mg by mouth 3 (three) times daily.   unknown at unknown  . QUEtiapine (SEROQUEL) 50 MG tablet Take 100 mg by mouth 2 (two) times daily.   unknown at unknown  . senna-docusate (SENOKOT-S) 8.6-50 MG tablet Take 1-2 tablets by mouth 2 (two) times daily.   unknown at unknown  . traZODone (DESYREL) 100 MG tablet Take 100 mg by mouth 2 (two) times daily.   unknown at unknown  . venlafaxine XR (EFFEXOR-XR) 150 MG 24 hr capsule Take  150 mg by mouth 2 (two) times daily.   unknown at unknown  . etodolac (LODINE XL) 500 MG 24 hr tablet Take 500 mg by mouth daily.   05/19/2015 at 1200  . metFORMIN (GLUCOPHAGE) 500 MG tablet Take 1,000 mg by mouth 2 (two) times daily with a meal.   05/19/2015 at 0800   Scheduled: . acyclovir  10 mg/kg (Ideal) Intravenous 3 times per day  . [START ON 12/11/2015] antiseptic oral rinse  7 mL Mouth Rinse q12n4p  . chlorhexidine  15 mL Mouth Rinse BID  . clonazePAM  1 mg Oral 3 times per day  . enoxaparin (LOVENOX) injection  40 mg Subcutaneous Q24H  . insulin aspart  0-9 Units Subcutaneous 6 times per day  . lactulose  20 g Oral Q2H  . lisinopril  5 mg Oral Daily  . methadone  20 mg Oral Q6H  . metoprolol tartrate  25 mg Oral Daily  . ondansetron  4 mg Oral 3 times per day  . [START ON 12/11/2015] pantoprazole  40 mg Oral BH-q7a  . piperacillin-tazobactam (ZOSYN)  IV  3.375 g Intravenous 3 times per day  . senna-docusate  1-2 tablet Oral BID  . sodium chloride flush  3 mL Intravenous Q12H  . vancomycin  750 mg Intravenous Q12H  . venlafaxine XR  150 mg Oral BID    ROS: Unable to obtain due to mental status  Physical Examination: Blood pressure 139/89, pulse 142, temperature 102.6 F (39.2 C), temperature source Rectal, resp. rate 33, height 5\' 5"  (1.651 m), weight 79.697 kg (175 lb 11.2 oz), SpO2 100 %.  HEENT-  Normocephalic, no lesions, without obvious abnormality.  Normal external eye and conjunctiva.  Normal TM's bilaterally.  Normal auditory canals and external ears. Normal external nose, mucus membranes and septum.  Normal pharynx. Cardiovascular- S1, S2 normal, pulses palpable throughout   Lungs- chest clear, no wheezing, rales, normal symmetric air entry Abdomen- soft, non-tender; bowel sounds normal; no masses,  no organomegaly Extremities- no edema, right foot cyanotic Lymph-no adenopathy palpable Musculoskeletal-no joint tenderness, deformity or swelling Skin-warm and dry, no  hyperpigmentation, vitiligo, or suspicious lesions  Neurological Examination Mental Status: Lethargic and difficult to arouse.  When aroused moans with no intelligent speech noted.  Nods head at times.  Does not follow commands.   Cranial Nerves: II: Discs flat bilaterally; Blinks to bilateral confrontation, pupils equal, round, reactive to light and accommodation III,IV, VI: ptosis not present, right gaze preference.  Able to achieve full excursion with oculocephalic maneuvers V,VII: grimace symmetric, facial light touch sensation normal bilaterally VIII: hearing normal bilaterally IX,X: gag reflex present XI: bilateral shoulder shrug XII: unable to test Motor: Rigors  noted.  Arms in flexed position and patient resists extension.  Withdraws lower extremities Sensory: Responds to noxious stimuli throughout Deep Tendon Reflexes: 2+ and symmetric throughout Plantars: Right: downgoing   Left: downgoing Cerebellar: Unable to test Gait: not tested due to safety concerns   Laboratory Studies:   Basic Metabolic Panel:  Recent Labs Lab 12/10/15 0938 12/10/15 1625  NA 146*  --   K 4.0  --   CL 111  --   CO2 18*  --   GLUCOSE 288*  --   BUN 37*  --   CREATININE 1.11* 1.27*  CALCIUM 9.9  --     Liver Function Tests:  Recent Labs Lab 12/10/15 0938  AST 158*  ALT 66*  ALKPHOS 76  BILITOT 0.9  PROT 8.2*  ALBUMIN 4.6   No results for input(s): LIPASE, AMYLASE in the last 168 hours.  Recent Labs Lab 12/10/15 1010 12/10/15 1625  AMMONIA 31 37*    CBC:  Recent Labs Lab 12/10/15 0938 12/10/15 1625  WBC 21.2* 29.5*  HGB 12.9 15.0  HCT 38.1 45.3  MCV 84.1 85.4  PLT 390 430    Cardiac Enzymes: No results for input(s): CKTOTAL, CKMB, CKMBINDEX, TROPONINI in the last 168 hours.  BNP: Invalid input(s): POCBNP  CBG:  Recent Labs Lab 12/10/15 1533 12/10/15 1828 12/10/15 1940  GLUCAP 347* 288* 60*    Microbiology: Results for orders placed or performed  during the hospital encounter of 12/10/15  Rapid Influenza A&B Antigens ([facility] only)     Status: None   Collection Time: 12/10/15 10:10 AM  Result Value Ref Range Status   Influenza A ([facility]) NEGATIVE  Final   Influenza B ([facility]) NEGATIVE  Final    Coagulation Studies: No results for input(s): LABPROT, INR in the last 72 hours.  Urinalysis:  Recent Labs Lab 12/10/15 1010  COLORURINE YELLOW*  LABSPEC 1.010  PHURINE 6.0  GLUCOSEU >500*  HGBUR 3+*  BILIRUBINUR NEGATIVE  KETONESUR 2+*  PROTEINUR 100*  NITRITE NEGATIVE  LEUKOCYTESUR NEGATIVE    Lipid Panel:  No results found for: CHOL, TRIG, HDL, CHOLHDL, VLDL, LDLCALC  HgbA1C: No results found for: HGBA1C  Urine Drug Screen:  No results found for: LABOPIA, COCAINSCRNUR, LABBENZ, AMPHETMU, THCU, LABBARB  Alcohol Level: No results for input(s): ETH in the last 168 hours.   Imaging: Ct Head Wo Contrast  12/10/2015  CLINICAL DATA:  Fall this morning with head injury. Progressive altered mental status and frequent falls. Metastatic breast carcinoma. EXAM: CT HEAD WITHOUT CONTRAST TECHNIQUE: Contiguous axial images were obtained from the base of the skull through the vertex without intravenous contrast. COMPARISON:  None. FINDINGS: There is no evidence of intracranial hemorrhage, brain edema, or other signs of acute infarction. There is no evidence of intracranial mass lesion or mass effect. No abnormal extraaxial fluid collections are identified. Mild cerebral atrophy is noted. No evidence of hydrocephalus. No evidence skull fracture or pneumocephalus. IMPRESSION: No acute intracranial abnormality. Mild cerebral atrophy. Electronically Signed   By: Earle Gell M.D.   On: 12/10/2015 11:21   Dg Chest Portable 1 View  12/10/2015  CLINICAL DATA:  Hallucinations.  Falls. EXAM: PORTABLE CHEST 1 VIEW COMPARISON:  05/19/2015. FINDINGS: Surgical clips noted over the right chest. Borderline cardiomegaly with mild pulmonary interstitial prominence.  A mild component of congestive heart failure cannot be completely excluded. Interstitial pneumonitis cannot be excluded. No pleural effusion or pneumothorax. No acute bony abnormality. IMPRESSION: 1. Borderline cardiomegaly with mild bilateral pulmonary interstitial prominence.  Mild congestive heart failure cannot be excluded. Mild interstitial pneumonitis cannot be excluded. 2. No acute bony abnormality.  No pneumothorax. Electronically Signed   By: Marcello Moores  Register   On: 12/10/2015 12:06     Assessment/Plan: 60 year old female presenting with altered mental status.  Head CT personally reviewed and shows no acute changes.  Recent MRI was unremarkable.  Multiple metabolic abnormalities noted.  Patient also on multiple possible offending medications.  Some of these were starting to be addressed on an outpatient basis.  Lastly, patient appears to be septic at this time.  Source unclear.  Can not rule out CNS.     Recommendations: 1.  Agree with tapering of possible offending medications 2.  Would not repeat MRI at this time 3.  EEG tonight 4.  Patient started on antibiotics.  With no source noted  Would also add Acyclovir.  LP scheduled for AM.    Alexis Goodell, MD Neurology [phone] 12/10/2015, 8:36 PM

## 2015-12-10 NOTE — H&P (Addendum)
[facility] NAME: Nancy Zuniga    MR#:  [id number]  DATE OF BIRTH:  Aug 02, 1956  DATE OF ADMISSION:  12/10/2015  PRIMARY CARE PHYSICIAN: Marco Collie, MD   REQUESTING/REFERRING PHYSICIAN: Dr. Owens Shark  CHIEF COMPLAINT:  Altered mental status HISTORY OF PRESENT ILLNESS:  Nancy Zuniga  is a 60 y.o. female with a known history of breast cancer with metastatic disease to the liver who has elected for no systemic therapy, diabetes and chronic pain who presents with altered mental status. Patient has had alteration of mental status since November. She was seen at [facility] for altered mental status and diagnosed with a urinary tract infection although her cultures were negative. She was discharged from the hospital on February 16. Her husband brings into the hospital today for worsening altered mental status with hallucinations, frequent falls and wandering. Patient has a urostomy bag upon arrival. Patient was noted to have fever, tachycardia and is very confused. [name]  PAST MEDICAL HISTORY:   Past Medical History  Diagnosis Date  . Breast cancer   . Diabetes mellitus without complication   . Anxiety   . Chronic pain   . Fibromyalgia     PAST SURGICAL HISTORY:   Past Surgical History  Procedure Laterality Date  . Bladder removal with urostomy     . Mastectomy Right   . Appendectomy    . Abdominal hysterectomy      SOCIAL HISTORY:   Social History  Substance Use Topics  . Smoking status: Never Smoker   . Smokeless tobacco: no  . Alcohol Use: No    FAMILY HISTORY:   Family History  Problem Relation Age of Onset  . Coronary artery disease    . Diabetes Mellitus II      DRUG ALLERGIES:   Allergies  Allergen Reactions  . Ciprofloxacin Hives  . Codeine Other (See Comments)    Reaction:  hyperactivity.  . Morphine And Related Other (See Comments)    Reaction: hyperactivity  . Tape Other (See Comments)    Reaction: blisters if left on too long.  . Iodine Rash     REVIEW OF SYSTEMS:  Due to altered mental status unable to obtain review of systems  MEDICATIONS AT HOME:   Prior to Admission medications   Medication Sig Start Date End Date Taking? Authorizing Provider  clonazePAM (KLONOPIN) 1 MG tablet Take 1 mg by mouth every 8 (eight) hours.    Yes Historical Provider, MD  hydrOXYzine (ATARAX/VISTARIL) 25 MG tablet Take 50 mg by mouth at bedtime.    Yes Historical Provider, MD  lactulose (CHRONULAC) 10 GM/15ML solution Take 20 g by mouth every 2 (two) hours.   Yes Historical Provider, MD  lisinopril (PRINIVIL,ZESTRIL) 5 MG tablet Take 5 mg by mouth daily.   Yes Historical Provider, MD  LORazepam (ATIVAN) 1 MG tablet Take 1 mg by mouth every 6 (six) hours as needed for anxiety.   Yes Historical Provider, MD  methadone (DOLOPHINE) 10 MG tablet Take 20 mg by mouth every 6 (six) hours.    Yes Historical Provider, MD  metoprolol tartrate (LOPRESSOR) 25 MG tablet Take 25 mg by mouth daily.   Yes Historical Provider, MD  naproxen (NAPROSYN) 500 MG tablet Take 500 mg by mouth 2 (two) times daily with a meal.   Yes Historical Provider, MD  nitrofurantoin (MACRODANTIN) 50 MG capsule Take 50 mg by mouth daily.   Yes Historical Provider, MD  ondansetron (ZOFRAN) 4 MG tablet Take 4 mg by mouth every 8 (eight) hours.   Yes Historical Provider, MD  oxyCODONE (OXY IR/ROXICODONE) 5 MG immediate release tablet Take 5 mg by mouth every 6 (six) hours as needed for moderate pain, severe pain or breakthrough pain.   Yes Historical Provider, MD  pantoprazole (PROTONIX) 20 MG tablet Take 20 mg by mouth every morning.    Yes Historical Provider, MD  pregabalin (LYRICA) 100 MG capsule Take 200 mg by mouth 3 (three) times daily.   Yes Historical Provider, MD  QUEtiapine (SEROQUEL) 50 MG tablet Take  100 mg by mouth 2 (two) times daily.   Yes Historical Provider, MD  senna-docusate (SENOKOT-S) 8.6-50 MG tablet Take 1-2 tablets by mouth 2 (two) times daily.   Yes Historical Provider, MD  traZODone (DESYREL) 100 MG tablet Take 100 mg by mouth 2 (two) times daily.   Yes Historical Provider, MD  venlafaxine XR (EFFEXOR-XR) 150 MG 24 hr capsule Take 150 mg by mouth 2 (two) times daily.   Yes Historical Provider, MD  etodolac (LODINE XL) 500 MG 24 hr tablet Take 500 mg by mouth daily.    Historical Provider, MD  metFORMIN (GLUCOPHAGE) 500 MG tablet Take 1,000 mg by mouth 2 (two) times daily with a meal.    Historical Provider, MD      VITAL SIGNS:  Blood pressure 196/89, pulse 129, temperature 101.4 F (38.6 C), temperature source Axillary, resp. rate 20, height 5\' 5"  (1.651 m), weight 79.697 kg (175 lb 11.2 oz), SpO2 97 %.  PHYSICAL EXAMINATION:  GENERAL:  60 y.o.-year-old patient lying in the bed with no acute distress.  EYES: Pupils equal, round, reactive to light and accommodation sluggish about 5 mm bilateral. No scleral icterus. HEENT: Head atraumatic, normocephalic. Oropharynx and nasopharynx clear.  NECK:  Supple, no jugular venous distention. No thyroid enlargement, no tenderness.  LUNGS: Rhonchi right middle lobe without crackles or use of accessory muscles of respiration.  CARDIOVASCULAR: Tachycardia No murmurs, rubs, or gallops.  ABDOMEN: Soft, nontender, nondistended. Bowel sounds present. No organomegaly or mass.  EXTREMITIES: No pedal edema, cyanosis, or clubbing.  NEUROLOGIC: does not follow commands,contracted mumbles with aphasia PSYCHIATRIC: The patient is alert not oriented cannot answer prescience appropriately  SKIN: No obvious rash, lesion, or ulcer.  Petechiae lower extremities.  LABORATORY PANEL:   CBC  Recent Labs Lab 12/10/15 0938  WBC 21.2*  HGB 12.9  HCT 38.1  PLT 390    ------------------------------------------------------------------------------------------------------------------  Chemistries   Recent Labs Lab 12/10/15 0938  NA 146*  K 4.0  CL 111  CO2 18*  GLUCOSE 288*  BUN 37*  CREATININE 1.11*  CALCIUM 9.9  AST 158*  ALT 66*  ALKPHOS 76  BILITOT 0.9   ------------------------------------------------------------------------------------------------------------------  Cardiac Enzymes No results for input(s): TROPONINI in the last 168 hours. ------------------------------------------------------------------------------------------------------------------  RADIOLOGY:  Ct Head Wo Contrast  12/10/2015  CLINICAL DATA:  Fall this morning with head injury. Progressive altered mental status and frequent falls. Metastatic breast carcinoma. EXAM: CT HEAD WITHOUT CONTRAST TECHNIQUE: Contiguous axial images were obtained from the base of the skull through the vertex without intravenous contrast. COMPARISON:  None. FINDINGS: There is no evidence of intracranial hemorrhage, brain edema, or other signs of acute infarction. There is no evidence of intracranial mass lesion or mass effect. No abnormal extraaxial fluid collections are identified. Mild cerebral atrophy is noted.  No evidence of hydrocephalus. No evidence skull fracture or pneumocephalus. IMPRESSION: No acute intracranial abnormality. Mild cerebral atrophy. Electronically Signed   By: Earle Gell M.D.   On: 12/10/2015 11:21    EKG:  Sinus tachycardia no ST elevation or depression  IMPRESSION AND PLAN:     60 year old female with history of bladder cancer status post urostomy, breast cancer with liver metastatic disease recently admitted to [facility] with altered mental status and increased frequency of falls here with sepsis and ongoing altered mental status.  1. Sepsis: Patient presents with tachycardia, leukocytosis and fever. Urinalysis is not impressive for urinary tract infection. Chest  x-ray is pending. Continue broad-spectrum antibiotics including Zosyn and vancomycin. Follow up on blood culture that were ordered in the emergency room.  2. Altered mental status with acute encephalopathy: Patient underwent MRI at [facility] which showed no evidence of metastatic disease. It was suspected that her encephalopathy could be due to polypharmacy. Methadone was decreased to 20 g 4 times a day and Lyrica was decreased to 100 mg twice a day. I have discontinued Lyrica and Ativan. I've also discontinued several of her other sedative medications such as Atarax, oxycodone, Lyrica, Seroquel, trazodone. She will continue Effexor or, methadone and clonazepam.. Order TSH, B12, RPR, HIV and ammonia level and EEG She will also require neurology consultation and a lumbar puncture in a.m. As mentioned MRI was just performed to [facility] so I will not repeat this. CT head here shows no acute intracranial hemorrhage or abnormality.  3. Diabetes: Continue sliding scale insulin. Hold metformin.  4. Essential hypertension: Continue metoprolol and lisinopril.  5. History of breast cancer with liver metastases: Patient has elected no therapy. Patient is followed by oncology at [facility].  6. History of bladder cancer status post urostomy.  7. Petechiae lower extremity: Platelet count is normal. Continue to monitor. Oncology consultation if needed.  All the records are reviewed and case discussed with ED provider.  CODE STATUS: DNR  CRITICAL CARE TOTAL TIME TAKING CARE OF THIS PATIENT: 65 minutes.    Hooper Petteway M.D on 12/10/2015 at 11:51 AM  Between 7am to 6pm - Pager - [phone] After 6pm go to [email or web address] - password [email or web address] [facility]  [facility]  Office  [phone]  CC: Primary care physician; Marco Collie, MD

## 2015-12-10 NOTE — OR Nursing (Signed)
Discussed with nurse ccu regarding need to hold pm lovenox if plan to have LP tomorrow. Nurse verbalized concern that pt would not tolerate procedure well due to tremors and shaking when touched. Discussed that if moderate sedation needed, then pt needs to be NPO after midnight. Dr Benjie Karvonen paged no answer yet.

## 2015-12-10 NOTE — Consult Note (Signed)
Pharmacy Antibiotic Note  Nancy Zuniga is a 60 y.o. female admitted on 12/10/2015 with sepsis.  Pharmacy has been consulted for acyclovir dosing.  Plan: acyclovir 10mg /kg q 8 hours. Will use ideal body weight since pt is obese  Pharmacy will continue to monitor renal function for any needed adjustments Height: 5\' 5"  (165.1 cm) Weight: 175 lb 11.2 oz (79.697 kg) IBW/kg (Calculated) : 57  Temp (24hrs), Avg:100.6 F (38.1 C), Min:99.2 F (37.3 C), Max:101.4 F (38.6 C)   Recent Labs Lab 12/10/15 0938 12/10/15 1249  WBC 21.2*  --   CREATININE 1.11*  --   LATICACIDVEN 3.4* 1.5    Estimated Creatinine Clearance: 57.6 mL/min (by C-G formula based on Cr of 1.11).    Allergies  Allergen Reactions  . Ciprofloxacin Hives  . Codeine Other (See Comments)    Reaction: hyperactivity.  . Morphine And Related Other (See Comments)    Reaction: hyperactivity  . Tape Other (See Comments)    Reaction: blisters if left on too long.  . Iodine Rash    Antimicrobials this admission: vancomycin 2/20 >>  Piperacillin/tazobactam 2/20 >>  Acyclovir 2/20>>  Dose adjustments this admission:   Microbiology results: 2/20 BCx: Sent 2/20 UCx: Sent  2/20 Flu: negative  Thank you for allowing pharmacy to be a part of this patient's care.  Ramond Dial 12/10/2015 4:05 PM

## 2015-12-10 NOTE — ED Provider Notes (Signed)
[facility] Emergency Department Provider Note  ____________________________________________  Time seen: 9:50 AM  I have reviewed the triage vital signs and the nursing notes.  History Limited secondary to altered mental status History primarily obtained from the patient's husband and EMS HISTORY  Chief Complaint Altered Mental Status      HPI Nancy Zuniga is a 60 y.o. female history of metastatic liver cancer former hospice patient presents via [facility] with history of increasing confusion/altered mental status per the patient's husband. Patient also noted to be febrile on presentation with Dr. 101.4. Per patient's husband she was admitted to [facility] and discharged on 12/06/2015 for possible urinary tract infection. Patient's husband states that she's had increasing levels of confusion since November of last year however this has acutely worsened over the past week     Past Medical History  Diagnosis Date  . Breast cancer   . Diabetes mellitus without complication   . Anxiety   . Chronic pain   . Fibromyalgia     There are no active problems to display for this patient.   Past Surgical History  Procedure Laterality Date  . Bladder removal    . Mastectomy Right   . Appendectomy    . Abdominal hysterectomy      Current Outpatient Rx  Name  Route  Sig  Dispense  Refill  . clonazePAM (KLONOPIN) 1 MG tablet   Oral   Take 1 mg by mouth 4 (four) times daily.         Marland Kitchen etodolac (LODINE XL) 500 MG 24 hr tablet   Oral   Take 500 mg by mouth daily.         Marland Kitchen glipiZIDE (GLUCOTROL XL) 5 MG 24 hr tablet   Oral   Take 5 mg by mouth daily.         . hydrOXYzine (ATARAX/VISTARIL) 25 MG tablet   Oral   Take 50 mg by mouth at bedtime.          Marland Kitchen lisinopril (PRINIVIL,ZESTRIL) 5 MG tablet   Oral   Take 5 mg by mouth daily.         Marland Kitchen LORazepam (ATIVAN) 1 MG tablet   Oral   Take 1 mg by mouth every 6 (six) hours as needed for  anxiety.         . metFORMIN (GLUCOPHAGE) 500 MG tablet   Oral   Take 1,000 mg by mouth 2 (two) times daily with a meal.         . methadone (DOLOPHINE) 10 MG tablet   Oral   Take 15 mg by mouth every 8 (eight) hours.         . nitrofurantoin (MACRODANTIN) 50 MG capsule   Oral   Take 50 mg by mouth every evening.         . pantoprazole (PROTONIX) 20 MG tablet   Oral   Take 20 mg by mouth every morning.          . pregabalin (LYRICA) 100 MG capsule   Oral   Take 200 mg by mouth 3 (three) times daily.         Marland Kitchen venlafaxine XR (EFFEXOR-XR) 150 MG 24 hr capsule   Oral   Take 150 mg by mouth 2 (two) times daily.           Allergies Ciprofloxacin; Codeine; Morphine and related; Tape; and Iodine  Family History  Problem Relation Age of Onset  . Coronary artery  disease    . Diabetes Mellitus II      Social History Social History  Substance Use Topics  . Smoking status: Never Smoker   . Smokeless tobacco: Not on file  . Alcohol Use: No    Review of Systems  Constitutional: Negative for fever. Eyes: Negative for visual changes. ENT: Negative for sore throat. Cardiovascular: Negative for chest pain. Respiratory: Negative for shortness of breath. Gastrointestinal: Negative for abdominal pain, vomiting and diarrhea. Genitourinary: Negative for dysuria. Musculoskeletal: Negative for back pain. Skin: Negative for rash. Neurological: Negative for headaches, focal weakness or numbness.   10-point ROS otherwise negative.  ____________________________________________   PHYSICAL EXAM:  VITAL SIGNS: ED Triage Vitals  Enc Vitals Group     BP 12/10/15 0919 154/115 mmHg     Pulse --      Resp 12/10/15 0919 20     Temp 12/10/15 0919 100.1 F (37.8 C)     Temp Source 12/10/15 0919 Oral     SpO2 12/10/15 0919 95 %     Weight 12/10/15 0919 175 lb 11.2 oz (79.697 kg)     Height 12/10/15 0919 5\' 5"  (1.651 m)     Head Cir --      Peak Flow --      Pain  Score --      Pain Loc --      Pain Edu? --      Excl. in [place]? --      Constitutional: Alert markedly confused. Tremulous Eyes: Conjunctivae are normal. PERRL. Normal extraocular movements. ENT   Head: Normocephalic and atraumatic.   Nose: No congestion/rhinnorhea.   Mouth/Throat: Mucous membranes are moist.   Neck: No stridor. Hematological/Lymphatic/Immunilogical: No cervical lymphadenopathy. Cardiovascular: Normal rate, regular rhythm. Normal and symmetric distal pulses are present in all extremities. No murmurs, rubs, or gallops. Respiratory: Normal respiratory effort without tachypnea nor retractions. Breath sounds are clear and equal bilaterally. No wheezes/rales/rhonchi. Gastrointestinal: Soft and nontender. No distention. There is no CVA tenderness. Genitourinary: deferred Musculoskeletal: Nontender with normal range of motion in all extremities. No joint effusions.  No lower extremity tenderness nor edema. Neurologic: Contracted bilateral upper extremities  Skin:  Skin is hot to touch Psychiatric: Mood and affect are normal. Speech and behavior are normal. Patient exhibits appropriate insight and judgment.  ____________________________________________    LABS (pertinent positives/negatives)  Labs Reviewed  CBC - Abnormal; Notable for the following:    WBC 21.2 (*)    All other components within normal limits  COMPREHENSIVE METABOLIC PANEL - Abnormal; Notable for the following:    Sodium 146 (*)    CO2 18 (*)    Glucose, Bld 288 (*)    BUN 37 (*)    Creatinine, Ser 1.11 (*)    Total Protein 8.2 (*)    AST 158 (*)    ALT 66 (*)    GFR calc non Af Amer 54 (*)    Anion gap 17 (*)    All other components within normal limits  URINALYSIS COMPLETEWITH MICROSCOPIC ([facility] ONLY) - Abnormal; Notable for the following:    Color, Urine YELLOW (*)    APPearance HAZY (*)    Glucose, UA >500 (*)    Ketones, ur 2+ (*)    Hgb urine dipstick 3+ (*)    Protein, ur  100 (*)    Bacteria, UA RARE (*)    Squamous Epithelial / LPF 0-5 (*)    All other components within normal limits  LACTIC ACID, PLASMA -  Abnormal; Notable for the following:    Lactic Acid, Venous 3.4 (*)    All other components within normal limits  RAPID INFLUENZA A&B ANTIGENS ([facility] ONLY)  URINE CULTURE  CULTURE, BLOOD (ROUTINE X 2)  CULTURE, BLOOD (ROUTINE X 2)  AMMONIA  LACTIC ACID, PLASMA     ______________________  RADIOLOGY  DG Chest Portable 1 View (Final result) Result time: 12/10/15 [id number]   Final result by Rad Results In Interface (12/10/15 12:06:18)   Narrative:   CLINICAL DATA: Hallucinations. Falls.  EXAM: PORTABLE CHEST 1 VIEW  COMPARISON: 05/19/2015.  FINDINGS: Surgical clips noted over the right chest. Borderline cardiomegaly with mild pulmonary interstitial prominence. A mild component of congestive heart failure cannot be completely excluded. Interstitial pneumonitis cannot be excluded. No pleural effusion or pneumothorax. No acute bony abnormality.  IMPRESSION: 1. Borderline cardiomegaly with mild bilateral pulmonary interstitial prominence. Mild congestive heart failure cannot be excluded. Mild interstitial pneumonitis cannot be excluded.  2. No acute bony abnormality. No pneumothorax.   Electronically Signed By: Marcello Moores Register On: 12/10/2015 12:06          CT Head Wo Contrast (Final result) Result time: 12/10/15 11:21:36   Final result by Rad Results In Interface (12/10/15 11:21:36)   Narrative:   CLINICAL DATA: Fall this morning with head injury. Progressive altered mental status and frequent falls. Metastatic breast carcinoma.  EXAM: CT HEAD WITHOUT CONTRAST  TECHNIQUE: Contiguous axial images were obtained from the base of the skull through the vertex without intravenous contrast.  COMPARISON: None.  FINDINGS: There is no evidence of intracranial hemorrhage, brain edema, or other signs of acute  infarction. There is no evidence of intracranial mass lesion or mass effect. No abnormal extraaxial fluid collections are identified. Mild cerebral atrophy is noted. No evidence of hydrocephalus. No evidence skull fracture or pneumocephalus.  IMPRESSION: No acute intracranial abnormality.  Mild cerebral atrophy.   Electronically Signed By: Earle Gell M.D. On: 12/10/2015 11:21      Critical Care performed: CRITICAL CARE Performed by: Marjean Donna N   Total critical care time: 60 minutes  Critical care time was exclusive of separately billable procedures and treating other patients.  Critical care was necessary to treat or prevent imminent or life-threatening deterioration.  Critical care was time spent personally by me on the following activities: development of treatment plan with patient and/or surrogate as well as nursing, discussions with consultants, evaluation of patient's response to treatment, examination of patient, obtaining history from patient or surrogate, ordering and performing treatments and interventions, ordering and review of laboratory studies, ordering and review of radiographic studies, pulse oximetry and re-evaluation of patient's condition.    ____________________________________________   INITIAL IMPRESSION / ASSESSMENT AND PLAN / ED COURSE  Pertinent labs & imaging results that were available during my care of the patient were reviewed by me and considered in my medical decision making (see chart for details).  History of physical exam, vital signs consistent with acute sepsis. Lab data revealed a leukocytosis of 21. Patient received IV normal saline 2 L on presentation as well as IV vancomycin and Zosyn. Patient discussed with Dr. Genia Harold for hospital admission for further evaluation and management. ____________________________________________   FINAL CLINICAL IMPRESSION(S) / ED DIAGNOSES  Final diagnoses:  Sepsis, due to unspecified  organism [facility])      Gregor Hams, MD 12/12/15 1524

## 2015-12-10 NOTE — Progress Notes (Signed)
Pt arrived from ED with tremors and muttering incoherantly.  Pt remain on stable on bipap.  Pt continues to have a high RR in the 30's, and tachycardic with a HR in the 140's. Pt continues to not follow commands, mutters incoherently, becomes very rigid when touched, and continues to have tremors.   Pt family notified nursing staff that the pt "asked me for a gun earlier this week, and she has mentioned suicide as well".  Pt husband also communicated that at previous hospitalization pt "had to be literally tied down to the bed when she had her ostomy placed because she was trying to hurt herself."  Pt has began hiding medications from family and taking them behind closed doors so the husband is not sure what she is actually taking.   House Animator both notified of family concerns for suicide. Suicide precautions initiated.  Report given to [facility].

## 2015-12-10 NOTE — Procedures (Signed)
ELECTROENCEPHALOGRAM REPORT   Patient: Nancy Zuniga       Room #: IC17A-AA EEG No. ID: [id number] Age: 60 y.o.        Sex: female Referring Physician: Mody Report Date:  12/10/2015        Interpreting Physician: Alexis Goodell  History: [name] is an 60 y.o. female with altered mental status  Medications:  Scheduled: . acyclovir  10 mg/kg (Ideal) Intravenous 3 times per day  . [START ON 12/11/2015] antiseptic oral rinse  7 mL Mouth Rinse q12n4p  . chlorhexidine  15 mL Mouth Rinse BID  . clonazePAM  1 mg Oral 3 times per day  . enoxaparin (LOVENOX) injection  40 mg Subcutaneous Q24H  . insulin aspart  0-9 Units Subcutaneous 6 times per day  . lactulose  20 g Oral Q2H  . lisinopril  5 mg Oral Daily  . methadone  20 mg Oral Q6H  . metoprolol tartrate  25 mg Oral Daily  . ondansetron  4 mg Oral 3 times per day  . [START ON 12/11/2015] pantoprazole  40 mg Oral BH-q7a  . piperacillin-tazobactam (ZOSYN)  IV  3.375 g Intravenous 3 times per day  . senna-docusate  1-2 tablet Oral BID  . sodium chloride flush  3 mL Intravenous Q12H  . vancomycin  750 mg Intravenous Q12H  . venlafaxine XR  150 mg Oral BID    Conditions of Recording:  This is a 16 channel EEG carried out with the patient in the poorly resopnsive state.  Description:  The background activity is dominated by muscle and movement artifact.  On rare occasions the background rhythm can be evaluated.  It consists of slow and poorly organized activity that is mostly dominated by delta and theta activity.  Occasionally faster frequencies are noted as well.   There was no evidence of stage II sleep noted.   No epileptiform activity is noted.   Hyperventilation and intermittent photic stimulation were not performed.   IMPRESSION: This is an abnormal EEG secondary to general background slowing.  This finding may be seen with a diffuse disturbance that is etiologically nonspecific, but may include a metabolic encephalopathy,  among other possibilities.  No epileptiform activity was noted.     Alexis Goodell, MD Neurology [phone] 12/10/2015, 8:28 PM

## 2015-12-10 NOTE — Progress Notes (Signed)
Dr.Diamond notified of pt suicide precautions. No new orders given at this time.

## 2015-12-10 NOTE — ED Notes (Signed)
Patient placed on 3L [place] at this time due to SpO2 88%.

## 2015-12-10 NOTE — ED Notes (Signed)
Admitting MD at bedside for repeat neuro exam. Pupils are reactive. Very sluggish to respond. Patient is mumbling and talking to people who are not there, patient states, "Mila Merry, It was, what was it, yea yea I know". When asked who she was talking to. Patient states, "I dont know".

## 2015-12-10 NOTE — Progress Notes (Signed)
EEG completed, results pending. 

## 2015-12-10 NOTE — ED Notes (Addendum)
Pt comes into the ED via EMS from home with c/o AMS since discharged from [facility] 12/06/15 states cared for at home by husband , states she has worsened, having hallucinations, more frequent falls, wondering outside.. pt has urostomy with bag present upon arrival today.. Arms are constracted.. Pt talking to unseen person in the room named [facility]Marland Kitchen

## 2015-12-10 NOTE — Progress Notes (Signed)
Spoke with Dr. Marcille Blanco about Pt's condition. Pt unable to tolerate PO meds. MD said he would review chart and give new orders. Awaiting new orders. Will continue to monitor.

## 2015-12-10 NOTE — Progress Notes (Signed)
Pharmacy Antibiotic Note  Nancy Zuniga is a 60 y.o. female admitted on 12/10/2015 with sepsis.  Pharmacy has been consulted for vancomycin & piperacillin/tazobactam dosing.  Plan: Patient received 1000 mg vancomycin x 1 dose in the ED Will follow with maintenance dose of vancomycin 750 mg IV q 12 hours (with stacked dose to begin at 1730 - which is 6 hours after initial dose) Vancomycin trough scheduled for 2/22 at 0500, which is prior to the 5th dose and should represent steady state. Goal trough 15-20 mcg/mL  Piperacillin/tazobactam 3.375g IV q8h (4 hour infusion)  Kinetics: Ke: 0.052 Half-life: 13.3 hrs Vd: 46 L Cmin (calculated): ~ 17 mcg/mL Adjusted body weight = 66 kg CrCl = 57 mL/min  Height: 5\' 5"  (165.1 cm) Weight: 175 lb 11.2 oz (79.697 kg) IBW/kg (Calculated) : 57  Temp (24hrs), Avg:100.6 F (38.1 C), Min:99.2 F (37.3 C), Max:101.4 F (38.6 C)   Recent Labs Lab 12/10/15 0938 12/10/15 1249  WBC 21.2*  --   CREATININE 1.11*  --   LATICACIDVEN 3.4* 1.5    Estimated Creatinine Clearance: 57.6 mL/min (by C-G formula based on Cr of 1.11).    Allergies  Allergen Reactions  . Ciprofloxacin Hives  . Codeine Other (See Comments)    Reaction: hyperactivity.  . Morphine And Related Other (See Comments)    Reaction: hyperactivity  . Tape Other (See Comments)    Reaction: blisters if left on too long.  . Iodine Rash    Antimicrobials this admission: vancomycin 2/20 >>  Piperacillin/tazobactam 2/20 >>   Dose adjustments this admission: n/a  Microbiology results: 2/20 BCx: Sent 2/20 UCx: Sent  2/20 Flu: negative  Thank you for allowing pharmacy to be a part of this patient's care.  Lenis Noon, PharmD Clinical Pharmacist 12/10/2015 2:56 PM

## 2015-12-11 ENCOUNTER — Other Ambulatory Visit: Payer: Self-pay | Admitting: Diagnostic Radiology

## 2015-12-11 ENCOUNTER — Inpatient Hospital Stay

## 2015-12-11 DIAGNOSIS — G9340 Encephalopathy, unspecified: Secondary | ICD-10-CM

## 2015-12-11 DIAGNOSIS — Z8659 Personal history of other mental and behavioral disorders: Secondary | ICD-10-CM

## 2015-12-11 DIAGNOSIS — R410 Disorientation, unspecified: Secondary | ICD-10-CM

## 2015-12-11 LAB — CSF CELL COUNT WITH DIFFERENTIAL
Eosinophils, CSF: 0 %
Lymphs, CSF: 71 %
Monocyte-Macrophage-Spinal Fluid: 15 %
Other Cells, CSF: 0
RBC COUNT CSF: 38 /mm3 — AB (ref 0–3)
SEGMENTED NEUTROPHILS-CSF: 14 %
TUBE #: 3
WBC CSF: 6 /mm3

## 2015-12-11 LAB — GLUCOSE, CAPILLARY
Glucose-Capillary: 243 mg/dL — ABNORMAL HIGH (ref 65–99)
Glucose-Capillary: 260 mg/dL — ABNORMAL HIGH (ref 65–99)
Glucose-Capillary: 287 mg/dL — ABNORMAL HIGH (ref 65–99)
Glucose-Capillary: 243 mg/dL — ABNORMAL HIGH (ref 65–99)
Glucose-Capillary: 290 mg/dL — ABNORMAL HIGH (ref 65–99)

## 2015-12-11 LAB — PROTIME-INR
INR: 1.59
Prothrombin Time: 19 seconds — ABNORMAL HIGH (ref 11.4–15.0)

## 2015-12-11 LAB — APTT: APTT: 25 s (ref 24–36)

## 2015-12-11 LAB — GLUCOSE, CSF: GLUCOSE CSF: 182 mg/dL — AB (ref 40–70)

## 2015-12-11 LAB — PROTEIN, CSF: TOTAL PROTEIN, CSF: 47 mg/dL — AB (ref 15–45)

## 2015-12-11 LAB — RPR: RPR: NONREACTIVE

## 2015-12-11 LAB — VITAMIN B12: VITAMIN B 12: 174 pg/mL — AB (ref 180–914)

## 2015-12-11 MED ORDER — KETOROLAC TROMETHAMINE 30 MG/ML IJ SOLN
30.0000 mg | Freq: Once | INTRAMUSCULAR | Status: AC
  Administered 2015-12-11: 30 mg via INTRAVENOUS
  Filled 2015-12-11: qty 1

## 2015-12-11 MED ORDER — INSULIN GLARGINE 100 UNIT/ML ~~LOC~~ SOLN
8.0000 [IU] | Freq: Every day | SUBCUTANEOUS | Status: DC
  Administered 2015-12-11 – 2015-12-12 (×2): 8 [IU] via SUBCUTANEOUS
  Filled 2015-12-11 (×2): qty 0.08

## 2015-12-11 MED ORDER — LORAZEPAM 2 MG/ML IJ SOLN
1.0000 mg | Freq: Two times a day (BID) | INTRAMUSCULAR | Status: DC
  Administered 2015-12-11 – 2015-12-12 (×3): 1 mg via INTRAVENOUS
  Filled 2015-12-11 (×3): qty 1

## 2015-12-11 MED ORDER — MORPHINE SULFATE (PF) 2 MG/ML IV SOLN
2.0000 mg | INTRAVENOUS | Status: DC | PRN
  Administered 2015-12-12 (×2): 2 mg via INTRAVENOUS
  Filled 2015-12-11 (×2): qty 1

## 2015-12-11 NOTE — Progress Notes (Signed)
Patient ID: Nancy Zuniga, female   DOB: [birth year], 60 y.o.   MRN: [id number] Pt. Stable after L.P.

## 2015-12-11 NOTE — Progress Notes (Signed)
Dr. Jannifer Franklin updated on Pt's condition. Pt remains lethargic with minimal response and doesn't follow any commands. Pt's rectal temp of 39.8 and tylenol suppository given. MD reviewed Pt's chart. New orders to apply cooling blanket given. Will continue to monitor.

## 2015-12-11 NOTE — Progress Notes (Signed)
Initial Nutrition Assessment    INTERVENTION:   Coordination of Care: await diet progression   NUTRITION DIAGNOSIS:   Inadequate oral intake related to lethargy/confusion, acute illness as evidenced by NPO status.  GOAL:   Patient will meet greater than or equal to 90% of their needs  MONITOR:    (Energy Intake, Anthropometrics, Digestive System, Electrolyte/Renal Profile)  REASON FOR ASSESSMENT:   Malnutrition Screening Tool    ASSESSMENT:    Pt admitted with AMS, sepsis; pt with hx of breast cancer with mets to liver, also with hx of bladder cancer s/p urostomy tube; pt down for LP on visit today, CT and MRI brain negative. Pt currently on Bipap  Past Medical History  Diagnosis Date  . Breast cancer ([place])   . Diabetes mellitus without complication ([place])   . Anxiety   . Chronic pain   . Fibromyalgia     Diet Order:   NPO   Recent Labs Lab 12/10/15 0938 12/10/15 1625  NA 146*  --   K 4.0  --   CL 111  --   CO2 18*  --   BUN 37*  --   CREATININE 1.11* 1.27*  CALCIUM 9.9  --   GLUCOSE 288*  --     Glucose Profile:   Recent Labs  12/11/15 0347 12/11/15 0712 12/11/15 1217  GLUCAP 243* 260* 287*   Meds: ss novolog, lantus, lasix, lactulose  Food and Nutrition Related History:  Unable to complete Nutrition-Focused physical exam at this time.    Height:   Ht Readings from Last 1 Encounters:  12/10/15 5\' 5"  (1.651 m)    Weight:   Wt Readings from Last 1 Encounters:  12/10/15 175 lb 11.2 oz (79.697 kg)    BMI:  Body mass index is 29.24 kg/(m^2).  Estimated Nutritional Needs:   Kcal:  [id number] kcals (BEE 1377, 1.2 AF, 1.1-1.3 IF)   Protein:  80-96 g (1.0-1.2 g/kg)   Fluid:  2000-2400 mL (25-30 ml/kg)    MODERATE Care Level  Kerman Passey MS, RD, LDN [phone] Pager  [phone] Weekend/On-Call Pager

## 2015-12-11 NOTE — Progress Notes (Signed)
Patient has MOST form. Discussed form with patitent's husband. She was lucin and cler when she signed the papers. We will stop Bipap and try O2, if she fails then she will undergo CONFORT CARE orders. Husband says this is what she would want.  TIME SPENT 35 minutes

## 2015-12-11 NOTE — Progress Notes (Signed)
Inpatient Diabetes Program Recommendations  AACE/ADA: New Consensus Statement on Inpatient Glycemic Control (2015)  Target Ranges:  Prepandial:   less than 140 mg/dL      Peak postprandial:   less than 180 mg/dL (1-2 hours)      Critically ill patients:  140 - 180 mg/dL   Review of Glycemic Control  Results for Nancy Zuniga, Nancy Zuniga (MRN [id number]) as of 12/11/2015 09:08  Ref. Range 12/10/2015 18:28 12/10/2015 19:40 12/10/2015 23:46 12/11/2015 03:47 12/11/2015 07:12  Glucose-Capillary Latest Ref Range: 65-99 mg/dL 288 (H) 308 (H) 223 (H) 243 (H) 260 (H)    Diabetes history: Type 2 Outpatient Diabetes medications: Metformin 1000mg  bid Current orders for Inpatient glycemic control: Novolog 0-9 units q4h  Inpatient Diabetes Program Recommendations: Elevated CBG- please consider starting low dose basal insulin, 8 units Lantus qday (0.15units/kg)- to be given even if patient is NPO.   Gentry Fitz, RN, BA, MHA, CDE Diabetes Coordinator Inpatient Diabetes Program  [phone] (Team Pager) [phone] ([place]) 12/11/2015 9:12 AM

## 2015-12-11 NOTE — Consult Note (Signed)
[facility] Consult   Reason for Consult:  Consult for 60 year old woman with sepsis metastatic cancer multiple medical problems. Concern because her husband had reported she had made some suicidal statements. Referring Physician:  Marcille Blanco Patient Identification: Nancy Zuniga MRN:  [id number] Principal Diagnosis: Acute delirium Diagnosis:   Patient Active Problem List   Diagnosis Date Noted  . Acute delirium [R41.0] 12/11/2015  . History of bipolar disorder [Z86.59] 12/11/2015  . Sepsis ([place]) [A41.9] 12/10/2015  . Respiratory failure ([place]) [J96.90] 12/10/2015  . Confusion [R41.0]     Total Time spent with patient: 30 minutes  Subjective:   Nancy Zuniga is a 60 y.o. female patient admitted with patient is not able to give any history currently.  HPI:  Husband interviewed. Nursing interviewed. Chart reviewed. This is a 60 year old woman who has end-stage cancer metastatic to has been under hospice care. Husband brought her into the hospital with altered mental status with what sounds like worsening confusion. She has deteriorated since being in the critical care unit and is now on BiPAP and unable to give any history. The husband had reported to staff that over the last week the patient had made some suicidal statements. He said that she had asked him at one point for a gun and had been talking a great deal about death recently. Patient is reported to have a history of bipolar disorder although this had not been discussed in any detail in any of the other notes. Because of this the patient was given a sitter and psychiatry consult was requested.  Social history: Patient and her husband been together for about 16 years. Lived together. Husband appears to be supportive. Patient had been referred to hospice care and followed by them.  Medical history: Metastatic breast cancer. Had thought to be advancing quite a bit but husband reports that more recently she had been told  that the cancer was decreasing in its mass. Patient is currently septic with respiratory failure.  Substance abuse history: Nothing documented in the old chart. Husband did not mention anything about it. No documentation to suggest that this is been a problem.  Past Psychiatric History: There is no documentation anywhere in the notes about psychiatric treatment. Husband says as far as he knows she has never been in a psychiatric hospital. She was following up with Dr. Kasandra Knudsen in [place] and was on clonazepam and Effexor. Also apparently more recently had been put on Seroquel. Also more recently apparently hospice had added Ativan on top of clonazepam. No known history of suicide attempts. No described history of mania.  Risk to Self: Is patient at risk for suicide?: Yes Risk to Others:   Prior Inpatient Therapy:   Prior Outpatient Therapy:    Past Medical History:  Past Medical History  Diagnosis Date  . Breast cancer ([place])   . Diabetes mellitus without complication ([place])   . Anxiety   . Chronic pain   . Fibromyalgia     Past Surgical History  Procedure Laterality Date  . Bladder removal    . Mastectomy Right   . Appendectomy    . Abdominal hysterectomy     Family History:  Family History  Problem Relation Age of Onset  . Coronary artery disease    . Diabetes Mellitus II     Family Psychiatric  History: Nothing is known about this and I can't get information from the patient right now Social History:  History  Alcohol Use No     History  Drug Use Not on file    Social History   Social History  . Marital Status: Married    Spouse Name: N/A  . Number of Children: N/A  . Years of Education: N/A   Social History Main Topics  . Smoking status: Never Smoker   . Smokeless tobacco: None  . Alcohol Use: No  . Drug Use: None  . Sexual Activity: Not Asked   Other Topics Concern  . None   Social History Narrative   Lives with her husband   Additional Social History:     Allergies:   Allergies  Allergen Reactions  . Ciprofloxacin Hives  . Codeine Other (See Comments)    Reaction: hyperactivity.  . Morphine And Related Other (See Comments)    Reaction: hyperactivity  . Tape Other (See Comments)    Reaction: blisters if left on too long.  . Iodine Rash    Labs:  Results for orders placed or performed during the hospital encounter of 12/10/15 (from the past 48 hour(s))  CBC     Status: Abnormal   Collection Time: 12/10/15  9:38 AM  Result Value Ref Range   WBC 21.2 (H) 3.6 - 11.0 K/uL   RBC 4.53 3.80 - 5.20 MIL/uL   Hemoglobin 12.9 12.0 - 16.0 g/dL   HCT 38.1 35.0 - 47.0 %   MCV 84.1 80.0 - 100.0 fL   MCH 28.6 26.0 - 34.0 pg   MCHC 34.0 32.0 - 36.0 g/dL   RDW 14.2 11.5 - 14.5 %   Platelets 390 150 - 440 K/uL  Comprehensive metabolic panel     Status: Abnormal   Collection Time: 12/10/15  9:38 AM  Result Value Ref Range   Sodium 146 (H) 135 - 145 mmol/L   Potassium 4.0 3.5 - 5.1 mmol/L   Chloride 111 101 - 111 mmol/L   CO2 18 (L) 22 - 32 mmol/L   Glucose, Bld 288 (H) 65 - 99 mg/dL   BUN 37 (H) 6 - 20 mg/dL   Creatinine, Ser 1.11 (H) 0.44 - 1.00 mg/dL   Calcium 9.9 8.9 - 10.3 mg/dL   Total Protein 8.2 (H) 6.5 - 8.1 g/dL   Albumin 4.6 3.5 - 5.0 g/dL   AST 158 (H) 15 - 41 U/L   ALT 66 (H) 14 - 54 U/L   Alkaline Phosphatase 76 38 - 126 U/L   Total Bilirubin 0.9 0.3 - 1.2 mg/dL   GFR calc non Af Amer 54 (L) >60 mL/min   GFR calc Af Amer >60 >60 mL/min    Comment: (NOTE) The eGFR has been calculated using the CKD EPI equation. This calculation has not been validated in all clinical situations. eGFR's persistently <60 mL/min signify possible Chronic Kidney Disease.    Anion gap 17 (H) 5 - 15  Blood culture (routine x 2)     Status: None (Preliminary result)   Collection Time: 12/10/15  9:38 AM  Result Value Ref Range   Specimen Description BLOOD LEFT FA    Special Requests      BOTTLES DRAWN AEROBIC AND ANAEROBIC ANA 4ML AER 5ML    Culture NO GROWTH < 24 HOURS    Report Status PENDING   Blood culture (routine x 2)     Status: None (Preliminary result)   Collection Time: 12/10/15  9:38 AM  Result Value Ref Range   Specimen Description BLOOD RIGHT HAND    Special Requests      BOTTLES DRAWN AEROBIC AND ANAEROBIC  ANA 3ML AER 5ML   Culture NO GROWTH < 24 HOURS    Report Status PENDING   Lactic acid, plasma     Status: Abnormal   Collection Time: 12/10/15  9:38 AM  Result Value Ref Range   Lactic Acid, Venous 3.4 (HH) 0.5 - 2.0 mmol/L    Comment: CRITICAL RESULT CALLED TO, READ BACK BY AND VERIFIED WITH EMMA HUNTER RN AT 1125 12/10/15 MSS.   Ammonia     Status: None   Collection Time: 12/10/15 10:10 AM  Result Value Ref Range   Ammonia 31 9 - 35 umol/L  Urinalysis complete, with microscopic ([facility] only)     Status: Abnormal   Collection Time: 12/10/15 10:10 AM  Result Value Ref Range   Color, Urine YELLOW (A) YELLOW   APPearance HAZY (A) CLEAR   Glucose, UA >500 (A) NEGATIVE mg/dL   Bilirubin Urine NEGATIVE NEGATIVE   Ketones, ur 2+ (A) NEGATIVE mg/dL   Specific Gravity, Urine 1.010 1.005 - 1.030   Hgb urine dipstick 3+ (A) NEGATIVE   pH 6.0 5.0 - 8.0   Protein, ur 100 (A) NEGATIVE mg/dL   Nitrite NEGATIVE NEGATIVE   Leukocytes, UA NEGATIVE NEGATIVE   RBC / HPF 0-5 0 - 5 RBC/hpf   WBC, UA 0-5 0 - 5 WBC/hpf   Bacteria, UA RARE (A) NONE SEEN   Squamous Epithelial / LPF 0-5 (A) NONE SEEN   Mucous PRESENT    Hyaline Casts, UA PRESENT   Urine culture     Status: None (Preliminary result)   Collection Time: 12/10/15 10:10 AM  Result Value Ref Range   Specimen Description URINE, RANDOM    Special Requests NONE    Culture      >=100,000 COLONIES/mL UNIDENTIFIED ORGANISM IDENTIFICATION TO FOLLOW    Report Status PENDING   Rapid Influenza A&B Antigens ([facility] only)     Status: None   Collection Time: 12/10/15 10:10 AM  Result Value Ref Range   Influenza A ([facility]) NEGATIVE    Influenza B ([facility]) NEGATIVE    Lactic acid, plasma     Status: None   Collection Time: 12/10/15 12:49 PM  Result Value Ref Range   Lactic Acid, Venous 1.5 0.5 - 2.0 mmol/L  Glucose, capillary     Status: Abnormal   Collection Time: 12/10/15  3:33 PM  Result Value Ref Range   Glucose-Capillary 347 (H) 65 - 99 mg/dL  TSH     Status: None   Collection Time: 12/10/15  4:25 PM  Result Value Ref Range   TSH 0.711 0.350 - 4.500 uIU/mL  RPR     Status: None   Collection Time: 12/10/15  4:25 PM  Result Value Ref Range   RPR Ser Ql Non Reactive Non Reactive    Comment: (NOTE) Performed At: [facility] [place], [place] [id number] Lindon Romp MD [id number]   Rapid HIV screen (HIV 1/2 Ab+Ag)     Status: None   Collection Time: 12/10/15  4:25 PM  Result Value Ref Range   HIV-1 P24 Antigen - HIV24 NON REACTIVE NON REACTIVE   HIV 1/2 Antibodies NON REACTIVE NON REACTIVE   Interpretation (HIV Ag Ab)      A non reactive test result means that HIV 1 or HIV 2 antibodies and HIV 1 p24 antigen were not detected in the specimen.  Ammonia     Status: Abnormal   Collection Time: 12/10/15  4:25 PM  Result Value Ref Range  Ammonia 37 (H) 9 - 35 umol/L  CBC     Status: Abnormal   Collection Time: 12/10/15  4:25 PM  Result Value Ref Range   WBC 29.5 (H) 3.6 - 11.0 K/uL   RBC 5.30 (H) 3.80 - 5.20 MIL/uL   Hemoglobin 15.0 12.0 - 16.0 g/dL   HCT 45.3 35.0 - 47.0 %   MCV 85.4 80.0 - 100.0 fL   MCH 28.4 26.0 - 34.0 pg   MCHC 33.2 32.0 - 36.0 g/dL   RDW 14.0 11.5 - 14.5 %   Platelets 430 150 - 440 K/uL  Creatinine, serum     Status: Abnormal   Collection Time: 12/10/15  4:25 PM  Result Value Ref Range   Creatinine, Ser 1.27 (H) 0.44 - 1.00 mg/dL   GFR calc non Af Amer 46 (L) >60 mL/min   GFR calc Af Amer 53 (L) >60 mL/min    Comment: (NOTE) The eGFR has been calculated using the CKD EPI equation. This calculation has not been validated in all clinical situations. eGFR's persistently <60 mL/min  signify possible Chronic Kidney Disease.   Glucose, capillary     Status: Abnormal   Collection Time: 12/10/15  6:28 PM  Result Value Ref Range   Glucose-Capillary 288 (H) 65 - 99 mg/dL  Glucose, capillary     Status: Abnormal   Collection Time: 12/10/15  7:40 PM  Result Value Ref Range   Glucose-Capillary 308 (H) 65 - 99 mg/dL   Comment 1 Notify RN   Glucose, capillary     Status: Abnormal   Collection Time: 12/10/15 11:46 PM  Result Value Ref Range   Glucose-Capillary 223 (H) 65 - 99 mg/dL  Glucose, capillary     Status: Abnormal   Collection Time: 12/11/15  3:47 AM  Result Value Ref Range   Glucose-Capillary 243 (H) 65 - 99 mg/dL   Comment 1 Notify RN   Glucose, capillary     Status: Abnormal   Collection Time: 12/11/15  7:12 AM  Result Value Ref Range   Glucose-Capillary 260 (H) 65 - 99 mg/dL  Protime-INR     Status: Abnormal   Collection Time: 12/11/15 10:01 AM  Result Value Ref Range   Prothrombin Time 19.0 (H) 11.4 - 15.0 seconds   INR 1.59   APTT     Status: None   Collection Time: 12/11/15 10:01 AM  Result Value Ref Range   aPTT 25 24 - 36 seconds  Glucose, CSF     Status: Abnormal   Collection Time: 12/11/15 11:49 AM  Result Value Ref Range   Glucose, CSF 182 (H) 40 - 70 mg/dL  Protein, CSF     Status: Abnormal   Collection Time: 12/11/15 11:49 AM  Result Value Ref Range   Total  Protein, CSF 47 (H) 15 - 45 mg/dL  CSF cell count with differential     Status: Abnormal   Collection Time: 12/11/15 11:49 AM  Result Value Ref Range   Tube # 3    Color, CSF CLEAR (A) COLORLESS   Appearance, CSF COLORLESS (A) CLEAR   Supernatant COLORLESS    RBC Count, CSF 38 (H) 0 - 3 /cu mm   WBC, CSF 6 /cu mm   Segmented Neutrophils-CSF 14 %   Lymphs, CSF 71 %   Monocyte-Macrophage-Spinal Fluid 15 %   Eosinophils, CSF 0 %   Other Cells, CSF 0   CSF culture     Status: None (Preliminary result)   Collection Time:  12/11/15 11:49 AM  Result Value Ref Range   Specimen  Description CSF    Special Requests NONE    Gram Stain      NO ORGANISMS SEEN FEW WBC SEEN MANY RED BLOOD CELLS    Culture PENDING    Report Status PENDING   Glucose, capillary     Status: Abnormal   Collection Time: 12/11/15 12:17 PM  Result Value Ref Range   Glucose-Capillary 287 (H) 65 - 99 mg/dL  Glucose, capillary     Status: Abnormal   Collection Time: 12/11/15  3:13 PM  Result Value Ref Range   Glucose-Capillary 243 (H) 65 - 99 mg/dL    Current Facility-Administered Medications  Medication Dose Route Frequency Provider Last Rate Last Dose  . acetaminophen (TYLENOL) suppository 650 mg  650 mg Rectal Q4H PRN Bettey Costa, MD   650 mg at 12/11/15 0306  . acyclovir (ZOVIRAX) 570 mg in dextrose 5 % 100 mL IVPB  10 mg/kg (Ideal) Intravenous 3 times per day Ramond Dial, RPH   570 mg at 12/11/15 2585  . antiseptic oral rinse (CPC / CETYLPYRIDINIUM CHLORIDE 0.05%) solution 7 mL  7 mL Mouth Rinse q12n4p Sital Mody, MD   7 mL at 12/11/15 1200  . chlorhexidine (PERIDEX) 0.12 % solution 15 mL  15 mL Mouth Rinse BID Bettey Costa, MD   15 mL at 12/10/15 2216  . enoxaparin (LOVENOX) injection 40 mg  40 mg Subcutaneous Q24H Bettey Costa, MD   Stopped at 12/10/15 2154  . hydrALAZINE (APRESOLINE) injection 10 mg  10 mg Intravenous Q6H PRN Sital Mody, MD      . insulin aspart (novoLOG) injection 0-9 Units  0-9 Units Subcutaneous 6 times per day Anders Simmonds, MD   3 Units at 12/11/15 1554  . insulin glargine (LANTUS) injection 8 Units  8 Units Subcutaneous Daily Bettey Costa, MD   8 Units at 12/11/15 1552  . labetalol (NORMODYNE,TRANDATE) injection 10 mg  10 mg Intravenous Q2H PRN Harrie Foreman, MD   10 mg at 12/11/15 0458  . lactulose (CHRONULAC) 10 GM/15ML solution 20 g  20 g Oral Q2H Sital Mody, MD   20 g at 12/10/15 1615  . LORazepam (ATIVAN) injection 1 mg  1 mg Intravenous BID Bettey Costa, MD   1 mg at 12/11/15 1551  . methadone (DOLOPHINE) tablet 20 mg  20 mg Oral Q6H Sital Mody, MD   20  mg at 12/10/15 1615  . ondansetron (ZOFRAN) tablet 4 mg  4 mg Oral 3 times per day Bettey Costa, MD   4 mg at 12/10/15 1615  . pantoprazole (PROTONIX) EC tablet 40 mg  40 mg Oral BH-q7a Bettey Costa, MD   40 mg at 12/11/15 0700  . piperacillin-tazobactam (ZOSYN) IVPB 3.375 g  3.375 g Intravenous 3 times per day Lenis Noon, RPH   3.375 g at 12/11/15 1552  . senna-docusate (Senokot-S) tablet 1-2 tablet  1-2 tablet Oral BID Bettey Costa, MD   1 tablet at 12/10/15 2156  . sodium chloride flush (NS) 0.9 % injection 3 mL  3 mL Intravenous Q12H Bettey Costa, MD   3 mL at 12/10/15 2217  . vancomycin (VANCOCIN) IVPB 750 mg/150 ml premix  750 mg Intravenous Q12H Lenis Noon, RPH   750 mg at 12/11/15 2778  . venlafaxine XR (EFFEXOR-XR) 24 hr capsule 150 mg  150 mg Oral BID Bettey Costa, MD   150 mg at 12/10/15 2157    Musculoskeletal: Strength &  Muscle Tone: flaccid, decreased and atrophy Gait & Station: unable to stand Patient leans: N/A  Psychiatric Specialty Exam: Review of Systems  Unable to perform ROS: medical condition    Blood pressure 149/66, pulse 103, temperature 100 F (37.8 C), temperature source Rectal, resp. rate 34, height 5' 5"  (1.651 m), weight 79.697 kg (175 lb 11.2 oz), SpO2 100 %.Body mass index is 29.24 kg/(m^2).  General Appearance: Patient is acutely L in the critical care unit  Eye Contact::  None  Speech:  Patient is not able to speak or communicate currently  Volume:  None  Mood:  Negative  Affect:  Negative  Thought Process:  Negative  Orientation:  Negative  Thought Content:  Negative  Suicidal Thoughts:  Husband reports the third been suicidal statements. This had not been previously documented and there has been no history of the patient attempting to harm herself here in the hospital and no known past suicide attempts  Homicidal Thoughts:  No  Memory:  Negative  Judgement:  Negative  Insight:  Negative  Psychomotor Activity:  Negative  Concentration:  Negative   Recall:  Negative  Fund of Knowledge:Negative  Language: Negative  Akathisia:  Negative  Handed:  Right  AIMS (if indicated):     Assets:  Social Support  ADL's:  Impaired  Cognition: Impaired,  Severe  Sleep:      Treatment Plan Summary: Plan 60 year old woman who is currently delirious on BiPAP unconscious and unresponsive. Based on the husband's description it sounds like she has been delirious recently. Although he mentions the suicidal statements there is also much talk about her having visual hallucinations and confusion. Presumed diagnosis would be delirium. There is no indication for involuntary commitment. Currently she is not agitated if she does become agitated she can be treated in the usual critical care [facility] for delirium. Patient has been on multiple deliria genic medications including 2 different benzodiazepines, Atarax, pain medication all of which could contribute to the delirium as could her acute medical problems. I wouldn't change anything currently about any of her prescribed psychiatric medicine. I will continue to follow-up if she regains consciousness. Meanwhile I have discontinue the sitter at all think there is any indication for that in the CCU at this time. If her mental status changes nursing staff can respond to it as appropriate  Disposition: No evidence of imminent risk to self or others at present.    Alethia Berthold, MD 12/11/2015 4:15 PM

## 2015-12-11 NOTE — Consult Note (Signed)
Pharmacy Antibiotic/Antiviral Note  Nancy Zuniga is a 60 y.o. female admitted on 12/10/2015 with sepsis. Pharmacy consulted to dose and monitor Acyclovir, Zosyn, and Vancomycin. Patient has metastatic breast cancer  Plan: Continue Acyclovir 10 mg/kg q8  Hours. Dose based on IBW.  Will continue Vancomycin 750 mg IV q12 hours.  Continue Zosyn 3.375 g IV q8 hours.    Pharmacy will continue to monitor renal function for any needed adjustments Height: 5\' 5"  (165.1 cm) Weight: 175 lb 11.2 oz (79.697 kg) IBW/kg (Calculated) : 57  Temp (24hrs), Avg:101.7 F (38.7 C), Min:99.2 F (37.3 C), Max:103.5 F (39.7 C)   Recent Labs Lab 12/10/15 0938 12/10/15 1249 12/10/15 1625  WBC 21.2*  --  29.5*  CREATININE 1.11*  --  1.27*  LATICACIDVEN 3.4* 1.5  --     Estimated Creatinine Clearance: 50.4 mL/min (by C-G formula based on Cr of 1.27).    Allergies  Allergen Reactions  . Ciprofloxacin Hives  . Codeine Other (See Comments)    Reaction: hyperactivity.  . Morphine And Related Other (See Comments)    Reaction: hyperactivity  . Tape Other (See Comments)    Reaction: blisters if left on too long.  . Iodine Rash    Antimicrobials this admission: vancomycin 2/20 >>  Piperacillin/tazobactam 2/20 >>  Acyclovir 2/20>>  Dose adjustments this admission:   Microbiology results: 2/20 BCx: Sent 2/20 UCx: Sent  2/20 Flu: negative  Thank you for allowing pharmacy to be a part of this patient's care.  Jenaveve Fenstermaker D 12/11/2015 8:23 AM

## 2015-12-11 NOTE — Progress Notes (Signed)
Per Dr. Kayleen Memos pt. Was taken off bipap and place on 5l nasal cannula. Pt. Advanced directives dictates she was not to be on bipap.

## 2015-12-11 NOTE — Progress Notes (Signed)
Subjective: Patient less rigorous today.  On a cooling blanket.    Objective: Current vital signs: BP 149/66 mmHg  Pulse 103  Temp(Src) 100 F (37.8 C) (Rectal)  Resp 34  Ht 5\' 5"  (1.651 m)  Wt 79.697 kg (175 lb 11.2 oz)  BMI 29.24 kg/m2  SpO2 100% Vital signs in last 24 hours: Temp:  [99.2 F (37.3 C)-103.5 F (39.7 C)] 100 F (37.8 C) (02/21 0800) Pulse Rate:  [96-153] 103 (02/21 0800) Resp:  [18-39] 34 (02/21 0800) BP: (111-160)/(44-116) 149/66 mmHg (02/21 0800) SpO2:  [88 %-100 %] 100 % (02/21 0800) FiO2 (%):  [60 %] 60 % (02/21 0800)  Intake/Output from previous day: 02/20 0701 - 02/21 0700 In: 1622.8 [IV Piggyback:1622.8] Out: 3350 [Urine:3350] Intake/Output this shift: Total I/O In: 111.4 [IV Piggyback:111.4] Out: -  Nutritional status:    Neurologic Exam: Mental Status: Lethargic and difficult to arouse. When aroused moans with no intelligent speech noted. Nods head at times. Does not follow commands.  Cranial Nerves: II: Discs flat bilaterally; Blinks to bilateral confrontation, pupils equal, round, reactive to light and accommodation III,IV, VI: ptosis not present, right gaze preference. Able to achieve full excursion with oculocephalic maneuvers V,VII: grimace symmetric, facial light touch sensation normal bilaterally VIII: hearing normal bilaterally IX,X: gag reflex present XI: bilateral shoulder shrug XII: unable to test Motor: Rigors noted. Arms in flexed position and patient resists extension. Withdraws lower extremities Sensory: Responds to noxious stimuli throughout Deep Tendon Reflexes: 2+ and symmetric throughout Plantars: Right: downgoingLeft: downgoing Cerebellar: Unable to test Gait: not tested due to safety concerns  Lab Results: Basic Metabolic Panel:  Recent Labs Lab 12/10/15 0938 12/10/15 1625  NA 146*  --   K 4.0  --   CL 111  --   CO2 18*  --   GLUCOSE 288*  --   BUN 37*  --    CREATININE 1.11* 1.27*  CALCIUM 9.9  --     Liver Function Tests:  Recent Labs Lab 12/10/15 0938  AST 158*  ALT 66*  ALKPHOS 76  BILITOT 0.9  PROT 8.2*  ALBUMIN 4.6   No results for input(s): LIPASE, AMYLASE in the last 168 hours.  Recent Labs Lab 12/10/15 1010 12/10/15 1625  AMMONIA 31 37*    CBC:  Recent Labs Lab 12/10/15 0938 12/10/15 1625  WBC 21.2* 29.5*  HGB 12.9 15.0  HCT 38.1 45.3  MCV 84.1 85.4  PLT 390 430    Cardiac Enzymes: No results for input(s): CKTOTAL, CKMB, CKMBINDEX, TROPONINI in the last 168 hours.  Lipid Panel: No results for input(s): CHOL, TRIG, HDL, CHOLHDL, VLDL, LDLCALC in the last 168 hours.  CBG:  Recent Labs Lab 12/10/15 1828 12/10/15 1940 12/10/15 2346 12/11/15 0347 12/11/15 0712  GLUCAP 288* 308* 223* 243* 260*    Microbiology: Results for orders placed or performed during the hospital encounter of 12/10/15  Blood culture (routine x 2)     Status: None (Preliminary result)   Collection Time: 12/10/15  9:38 AM  Result Value Ref Range Status   Specimen Description BLOOD LEFT FA  Final   Special Requests   Final    BOTTLES DRAWN AEROBIC AND ANAEROBIC ANA 4ML AER 5ML   Culture NO GROWTH < 24 HOURS  Final   Report Status PENDING  Incomplete  Blood culture (routine x 2)     Status: None (Preliminary result)   Collection Time: 12/10/15  9:38 AM  Result Value Ref Range Status   Specimen  Description BLOOD RIGHT HAND  Final   Special Requests   Final    BOTTLES DRAWN AEROBIC AND ANAEROBIC ANA 3ML AER 5ML   Culture NO GROWTH < 24 HOURS  Final   Report Status PENDING  Incomplete  Urine culture     Status: None (Preliminary result)   Collection Time: 12/10/15 10:10 AM  Result Value Ref Range Status   Specimen Description URINE, RANDOM  Final   Special Requests NONE  Final   Culture   Final    >=100,000 COLONIES/mL UNIDENTIFIED ORGANISM IDENTIFICATION TO FOLLOW    Report Status PENDING  Incomplete  Rapid Influenza  A&B Antigens ([facility] only)     Status: None   Collection Time: 12/10/15 10:10 AM  Result Value Ref Range Status   Influenza A ([facility]) NEGATIVE  Final   Influenza B [facility]) NEGATIVE  Final    Coagulation Studies:  Recent Labs  12/11/15 1001  LABPROT 19.0*  INR 1.59    Imaging: Ct Head Wo Contrast  12/10/2015  CLINICAL DATA:  Fall this morning with head injury. Progressive altered mental status and frequent falls. Metastatic breast carcinoma. EXAM: CT HEAD WITHOUT CONTRAST TECHNIQUE: Contiguous axial images were obtained from the base of the skull through the vertex without intravenous contrast. COMPARISON:  None. FINDINGS: There is no evidence of intracranial hemorrhage, brain edema, or other signs of acute infarction. There is no evidence of intracranial mass lesion or mass effect. No abnormal extraaxial fluid collections are identified. Mild cerebral atrophy is noted. No evidence of hydrocephalus. No evidence skull fracture or pneumocephalus. IMPRESSION: No acute intracranial abnormality. Mild cerebral atrophy. Electronically Signed   By: Earle Gell M.D.   On: 12/10/2015 11:21   Dg Chest Portable 1 View  12/10/2015  CLINICAL DATA:  Hallucinations.  Falls. EXAM: PORTABLE CHEST 1 VIEW COMPARISON:  05/19/2015. FINDINGS: Surgical clips noted over the right chest. Borderline cardiomegaly with mild pulmonary interstitial prominence. A mild component of congestive heart failure cannot be completely excluded. Interstitial pneumonitis cannot be excluded. No pleural effusion or pneumothorax. No acute bony abnormality. IMPRESSION: 1. Borderline cardiomegaly with mild bilateral pulmonary interstitial prominence. Mild congestive heart failure cannot be excluded. Mild interstitial pneumonitis cannot be excluded. 2. No acute bony abnormality.  No pneumothorax. Electronically Signed   By: Marcello Moores  Register   On: 12/10/2015 12:06    Medications:  Scheduled: . acyclovir  10 mg/kg (Ideal) Intravenous 3 times per  day  . antiseptic oral rinse  7 mL Mouth Rinse q12n4p  . chlorhexidine  15 mL Mouth Rinse BID  . enoxaparin (LOVENOX) injection  40 mg Subcutaneous Q24H  . insulin aspart  0-9 Units Subcutaneous 6 times per day  . insulin glargine  8 Units Subcutaneous Daily  . lactulose  20 g Oral Q2H  . methadone  20 mg Oral Q6H  . ondansetron  4 mg Oral 3 times per day  . pantoprazole  40 mg Oral BH-q7a  . piperacillin-tazobactam (ZOSYN)  IV  3.375 g Intravenous 3 times per day  . senna-docusate  1-2 tablet Oral BID  . sodium chloride flush  3 mL Intravenous Q12H  . vancomycin  750 mg Intravenous Q12H  . venlafaxine XR  150 mg Oral BID    Assessment/Plan: Patient has remained febrile and encephalopathic.  EEG shows no evidence of seizure activity but consistent with an encephalopathy.  Patient on broad spectrum antibiotics.  Husband has left prior wishes concerning care and unclear if they would be agreeable to a LP.  Will need further discussion about goals of care.    Recommendations: 1.  Agree with continued broad spectrum antibiotics.   2.  Will continue to follow with you.     LOS: 1 day   Alexis Goodell, MD Neurology [phone] 12/11/2015  11:53 AM

## 2015-12-11 NOTE — Care Management (Signed)
Patient has been followed by [facility] in the past and spent time at the facility for symptom management.  She was discharged due to medical stability.   There is now discussion regarding stopping the bipap and possible comfort measures

## 2015-12-11 NOTE — Progress Notes (Signed)
Dr. Estanislado Pandy notified about Pt's change in pupil size. Pt's pupils are now unequal with the left pupil size 54mm and right size 63mm. No other changes in Pt's condition. MD made aware. No new orders given at this time. MD said he would discuss with team and put new orders in if necessary. Will continue to monitor

## 2015-12-11 NOTE — Care Management (Signed)
Patient is currently open to home health services through [facility].  Services being provided not known at present

## 2015-12-11 NOTE — Progress Notes (Signed)
Pt was taken off bipap today per Dr. Benjie Karvonen and pt MOST form. Now on 5L nasal cannula.  Pt is in stable condition. Pt remains non- verbal, with fine tremors in all extremities. Pt remains on cooling blanket for elevated temps.   Report given to Hiral oncoming nurse.

## 2015-12-11 NOTE — Progress Notes (Signed)
[facility] [place] NAME: Nancy Zuniga    MR#:  [id number]  DATE OF BIRTH:  04/23/1956  SUBJECTIVE:   Patient is placed on BiPAP and just went for lumbar puncture  REVIEW OF SYSTEMS:    Review of Systems  Unable to perform ROS   Tolerating Diet: Nothing by mouth      DRUG ALLERGIES:   Allergies  Allergen Reactions  . Ciprofloxacin Hives  . Codeine Other (See Comments)    Reaction: hyperactivity.  . Morphine And Related Other (See Comments)    Reaction: hyperactivity  . Tape Other (See Comments)    Reaction: blisters if left on too long.  . Iodine Rash    VITALS:  Blood pressure 149/66, pulse 103, temperature 100 F (37.8 C), temperature source Rectal, resp. rate 34, height 5\' 5"  (1.651 m), weight 79.697 kg (175 lb 11.2 oz), SpO2 100 %.  PHYSICAL EXAMINATION:   Physical Exam  Constitutional: She is well-developed, well-nourished, and in no distress. No distress.  HENT:  Head: Normocephalic.  BiPAP Place  Eyes: No scleral icterus.  Sluggish pupils  Neck: Normal range of motion. Neck supple. No JVD present. No tracheal deviation present.  Cardiovascular: Normal rate, regular rhythm and normal heart sounds.  Exam reveals no gallop and no friction rub.   No murmur heard. Pulmonary/Chest: Effort normal and breath sounds normal. No respiratory distress. She has no wheezes. She has no rales. She exhibits no tenderness.  Abdominal: Soft. Bowel sounds are normal. She exhibits no distension and no mass. There is no tenderness. There is no rebound and no guarding.  Musculoskeletal: Normal range of motion. She exhibits no edema.  Neurological: She is alert.  Rigid UE  Does not follow commands  Skin: Skin is warm. No rash noted. No erythema.      LABORATORY PANEL:   CBC  Recent Labs Lab 12/10/15 1625  WBC 29.5*  HGB 15.0  HCT 45.3  PLT 430    ------------------------------------------------------------------------------------------------------------------  Chemistries   Recent Labs Lab 12/10/15 0938 12/10/15 1625  NA 146*  --   K 4.0  --   CL 111  --   CO2 18*  --   GLUCOSE 288*  --   BUN 37*  --   CREATININE 1.11* 1.27*  CALCIUM 9.9  --   AST 158*  --   ALT 66*  --   ALKPHOS 76  --   BILITOT 0.9  --    ------------------------------------------------------------------------------------------------------------------  Cardiac Enzymes No results for input(s): TROPONINI in the last 168 hours. ------------------------------------------------------------------------------------------------------------------  RADIOLOGY:  Ct Head Wo Contrast  12/10/2015  CLINICAL DATA:  Fall this morning with head injury. Progressive altered mental status and frequent falls. Metastatic breast carcinoma. EXAM: CT HEAD WITHOUT CONTRAST TECHNIQUE: Contiguous axial images were obtained from the base of the skull through the vertex without intravenous contrast. COMPARISON:  None. FINDINGS: There is no evidence of intracranial hemorrhage, brain edema, or other signs of acute infarction. There is no evidence of intracranial mass lesion or mass effect. No abnormal extraaxial fluid collections are identified. Mild cerebral atrophy is noted. No evidence of hydrocephalus. No evidence skull fracture or pneumocephalus. IMPRESSION: No acute intracranial abnormality. Mild cerebral atrophy. Electronically Signed   By: Earle Gell M.D.   On: 12/10/2015 11:21   Dg Chest Portable 1 View  12/10/2015  CLINICAL DATA:  Hallucinations.  Falls. EXAM: PORTABLE CHEST 1 VIEW COMPARISON:  05/19/2015. FINDINGS: Surgical clips noted over  the right chest. Borderline cardiomegaly with mild pulmonary interstitial prominence. A mild component of congestive heart failure cannot be completely excluded. Interstitial pneumonitis cannot be excluded. No pleural effusion or  pneumothorax. No acute bony abnormality. IMPRESSION: 1. Borderline cardiomegaly with mild bilateral pulmonary interstitial prominence. Mild congestive heart failure cannot be excluded. Mild interstitial pneumonitis cannot be excluded. 2. No acute bony abnormality.  No pneumothorax. Electronically Signed   By: Marcello Moores  Register   On: 12/10/2015 12:06     ASSESSMENT AND PLAN:   60 year old female with history of bladder cancer status post urostomy, breast cancer with liver metastatic disease recently admitted to [facility] with altered mental status and increased frequency of falls here with sepsis and ongoing altered mental status.  1. Sepsis: Patient presented with tachycardia, leukocytosis and fever. Urine culture greater than 100,000 colonies, blood cultures are negative to date Influenza negative She underwent LP 2/21 results of which are pending Chest x-ray shows pneumonitis Continue broad-spectrum antibiotics including Zosyn and vancomycin.   2. Altered mental status with acute encephalopathy: Patient underwent MRI at [facility] recently which showed no evidence of metastatic disease. Continue to hold several of her  sedative medications such as Atarax, oxycodone, Lyrica, Seroquel, trazodone, Effexor.  Continue methadone and in place of clonazepam order Ativan twice a day   TSH,RPR, HIV ammonia level are normal EEG shows generalized background slowing which is nonspecific. No epileptiform activity was noted.  CT head here shows no acute intracranial hemorrhage or abnormality.  3. Diabetes: Continue sliding scale insulin and Lantus 8 units at bedtime as per recommendations by diabetes coronary.   4. Essential hypertension: Continue when necessary labetalol with parameters  5. History of breast cancer with liver metastases: Patient has elected no therapy. Patient is followed by oncology at [facility].  6. History of bladder cancer status post urostomy.  7. Petechiae lower extremity: Platelet count is  normal. Continue to monitor. Oncology consultation if needed.   8. Acute hypoxic respiratory failure: Likely due to pneumonitis. Continue antibiotics. Wean BiPAP as tolerated.    CODE STATUS: DNR  CRITICALTOTAL TIME TAKING CARE OF THIS PATIENT: 50 minutes.     POSSIBLE D/C 3-5 days, DEPENDING ON CLINICAL CONDITION.   Nancy Zuniga M.D on 12/11/2015 at 12:27 PM  Between 7am to 6pm - Pager - [phone] After 6pm go to [email or web address] - password [email or web address] [facility]  Office  [phone]  CC: Primary care physician; Marco Collie, MD  Note: This dictation was prepared with Dragon dictation along with smaller phrase technology. Any transcriptional errors that result from this process are unintentional.

## 2015-12-12 LAB — GLUCOSE, CAPILLARY
GLUCOSE-CAPILLARY: 312 mg/dL — AB (ref 65–99)
GLUCOSE-CAPILLARY: 257 mg/dL — AB (ref 65–99)
Glucose-Capillary: 294 mg/dL — ABNORMAL HIGH (ref 65–99)
Glucose-Capillary: 313 mg/dL — ABNORMAL HIGH (ref 65–99)

## 2015-12-12 MED ORDER — LORAZEPAM 1 MG PO TABS: 1.0000 mg | ORAL_TABLET | Freq: Three times a day (TID) | ORAL | Status: AC

## 2015-12-12 MED ORDER — ACETAMINOPHEN 650 MG RE SUPP
650.0000 mg | RECTAL | Status: DC | PRN
  Administered 2015-12-12: 650 mg via RECTAL
  Filled 2015-12-12: qty 1

## 2015-12-12 MED ORDER — MORPHINE SULFATE (CONCENTRATE) 10 MG /0.5 ML PO SOLN: 10.0000 mg | ORAL | Status: AC | PRN

## 2015-12-12 NOTE — Progress Notes (Signed)
Inpatient Diabetes Program Recommendations  AACE/ADA: New Consensus Statement on Inpatient Glycemic Control (2015)  Target Ranges:  Prepandial:   less than 140 mg/dL      Peak postprandial:   less than 180 mg/dL (1-2 hours)      Critically ill patients:  140 - 180 mg/dL   Results for Nancy Zuniga, Nancy Zuniga (MRN [id number]) as of 12/12/2015 11:52  Ref. Range 12/10/2015 23:46 12/11/2015 03:47 12/11/2015 07:12 12/11/2015 12:17 12/11/2015 15:13 12/11/2015 19:55  Glucose-Capillary Latest Ref Range: 65-99 mg/dL 223 (H) 243 (H) 260 (H) 287 (H) 243 (H) 290 (H)    Results for Nancy Zuniga, Nancy Zuniga (MRN [id number]) as of 12/12/2015 11:52  Ref. Range 12/12/2015 00:42 12/12/2015 04:31 12/12/2015 07:34 12/12/2015 11:32  Glucose-Capillary Latest Ref Range: 65-99 mg/dL 294 (H) 313 (H) 312 (H) 257 (H)    Outpatient DM meds: Metformin 1000 mg bid  Current Insulin orders: Novolog 0-9 units q4h     Lantus 8 units daily    MD- If within goals of care for this patient (note possibility of switching to comfort care), please consider the following in-hospital insulin adjustments to help better control glucose levels:  1. Increase Lantus to 15 units daily (0.2 units/kg dosing)  2. Increase Novolog Correction Scale/ SSI to Moderate scale (0-15 units) Q4 hours     --Will follow patient during hospitalization--  Wyn Quaker RN, MSN, CDE Diabetes Coordinator Inpatient Glycemic Control Team Team Pager: [phone] (8a-5p)

## 2015-12-12 NOTE — Progress Notes (Signed)
Pt's HR and BP improved after one time dose of labatolol 10mg  IV given at 2040.  Pt remains tachycardic in 110s.  Cooling blanket turned off at 0300 for rectal temp of 98.5 and will continue to monitor.  Pt currently with increased tremors and rigidity, mumbling incoherently and unable to follow commands.  See [facility] flowsheets for further assessments. Will continue to monitor.

## 2015-12-12 NOTE — Discharge Summary (Signed)
[place] [facility] [place] NAME: Nancy Zuniga    MR#:  [id number]  DATE OF BIRTH:  May 03, 1956  DATE OF ADMISSION:  12/10/2015 ADMITTING PHYSICIAN: Bettey Costa, MD  DATE OF DISCHARGE: 12/12/2015  PRIMARY CARE PHYSICIAN: HODGES,BETH, MD    ADMISSION DIAGNOSIS:  Confusion [R41.0] Sepsis, due to unspecified organism ([place]) [A41.9]  DISCHARGE DIAGNOSIS:  Principal Problem:   Acute delirium Active Problems:   Sepsis ([place])   Respiratory failure ([place])   Confusion   History of bipolar disorder   SECONDARY DIAGNOSIS:   Past Medical History  Diagnosis Date  . Breast cancer ([place])   . Diabetes mellitus without complication ([place])   . Anxiety   . Chronic pain   . Fibromyalgia     HOSPITAL COURSE:   60 year old female with history of bladder cancer status post urostomy, breast cancer with liver metastatic disease recently admitted to [facility] with altered mental status and increased frequency of falls here with sepsis and ongoing altered mental status.  1. Sepsis: Patient presented with tachycardia, leukocytosis and fever. Urine culture greater than 100,000 colonies, blood cultures are negative to date Influenza negative She underwent LP 2/21 Chest x-ray shows pneumonitis broad-spectrum antibiotics with Zosyn and vancomycin.   2. Altered mental status with acute encephalopathy after discussing patient's prognosis with the husband, patient will be comfort care. As a most form that says that she would want to be comfort care.  According to this form we are being aggressive with her care.    3. Diabetes: comfort care  4. Essential hypertension: Comfort care  5. History of breast cancer with liver metastases: Comfort care  6. History of bladder cancer status post urostomy.  7. Petechiae lower extremity: Comfort care  8. Acute hypoxic respiratory failure: Likely due to pneumonitis.Nasal cannula for comfort care    DISCHARGE  CONDITIONS AND DIET:  Guarded NPO  CONSULTS OBTAINED:  Treatment Team:  Catarina Hartshorn, MD Alexis Goodell, MD Gonzella Lex, MD  DRUG ALLERGIES:   Allergies  Allergen Reactions  . Ciprofloxacin Hives  . Codeine Other (See Comments)    Reaction: hyperactivity.  . Morphine And Related Other (See Comments)    Reaction: hyperactivity  . Tape Other (See Comments)    Reaction: blisters if left on too long.  . Iodine Rash    DISCHARGE MEDICATIONS:   Current Discharge Medication List    START taking these medications   Details  Morphine Sulfate (MORPHINE CONCENTRATE) 10 mg / 0.5 ml concentrated solution Take 0.5 mLs (10 mg total) by mouth every 2 (two) hours as needed for severe pain. Qty: 30 mL, Refills: 0      CONTINUE these medications which have CHANGED   Details  LORazepam (ATIVAN) 1 MG tablet Take 1 tablet (1 mg total) by mouth every 8 (eight) hours. Qty: 30 tablet, Refills: 0      STOP taking these medications     clonazePAM (KLONOPIN) 1 MG tablet      hydrOXYzine (ATARAX/VISTARIL) 25 MG tablet      lactulose (CHRONULAC) 10 GM/15ML solution      lisinopril (PRINIVIL,ZESTRIL) 5 MG tablet      methadone (DOLOPHINE) 10 MG tablet      metoprolol tartrate (LOPRESSOR) 25 MG tablet      naproxen (NAPROSYN) 500 MG tablet      nitrofurantoin (MACRODANTIN) 50 MG capsule      ondansetron (ZOFRAN) 4 MG tablet      oxyCODONE (OXY IR/ROXICODONE)  5 MG immediate release tablet      pantoprazole (PROTONIX) 20 MG tablet      pregabalin (LYRICA) 100 MG capsule      QUEtiapine (SEROQUEL) 50 MG tablet      senna-docusate (SENOKOT-S) 8.6-50 MG tablet      traZODone (DESYREL) 100 MG tablet      venlafaxine XR (EFFEXOR-XR) 150 MG 24 hr capsule      etodolac (LODINE XL) 500 MG 24 hr tablet      metFORMIN (GLUCOPHAGE) 500 MG tablet               Today   CHIEF COMPLAINT:     VITAL SIGNS:  Blood pressure 97/82, pulse 107, temperature 100.5 F (38.1  C), temperature source Rectal, resp. rate 23, height 5\' 5"  (1.651 m), weight 79.697 kg (175 lb 11.2 oz), SpO2 99 %.   REVIEW OF SYSTEMS:  Review of Systems  Unable to perform ROS    PHYSICAL EXAMINATION:  Physical Exam  Constitutional: She is well-developed, well-nourished, and in no distress. No distress.  HENT:  Head: Normocephalic.  Eyes: No scleral icterus.  Neck: Normal range of motion. Neck supple. No JVD present. No tracheal deviation present.  Cardiovascular: Normal rate, regular rhythm and normal heart sounds. Exam reveals no gallop and no friction rub.  No murmur heard. Pulmonary/Chest: Effort normal and breath sounds normal. No respiratory distress. She has no wheezes. She has no rales. She exhibits no tenderness.  Abdominal: Soft. Bowel sounds are normal. She exhibits no distension and no mass. There is no tenderness. There is no rebound and no guarding.  Musculoskeletal: Normal range of motion. She exhibits no edema.  Neurological:  Rigid UE  Does not follow commands Unresponsive  Skin: Skin is warm. No rash noted. No erythema.   DATA REVIEW:   CBC  Recent Labs Lab 12/10/15 1625  WBC 29.5*  HGB 15.0  HCT 45.3  PLT 430    Chemistries   Recent Labs Lab 12/10/15 0938 12/10/15 1625  NA 146*  --   K 4.0  --   CL 111  --   CO2 18*  --   GLUCOSE 288*  --   BUN 37*  --   CREATININE 1.11* 1.27*  CALCIUM 9.9  --   AST 158*  --   ALT 66*  --   ALKPHOS 76  --   BILITOT 0.9  --     Cardiac Enzymes No results for input(s): TROPONINI in the last 168 hours.  Microbiology Results  @MICRORSLT48 @  RADIOLOGY:  Delsa Grana Of Needle/cath Tip For Spinal Inject Lt  12/11/2015  CLINICAL DATA:  Confusion. EXAM: DIAGNOSTIC LUMBAR PUNCTURE UNDER FLUOROSCOPIC GUIDANCE FLUOROSCOPY TIME:  Radiation Exposure Index (as provided by the fluoroscopic device): 32.4 mGy PROCEDURE: Informed consent was obtained from the patient prior to the procedure,  including potential complications of headache, allergy, and pain. With the patient prone, the lower back was prepped with Betadine. 1% Lidocaine was used for local anesthesia. Lumbar puncture was performed at the L4-L5 level using a 22 gauge needle with return of clear CSF. 10 ml of CSF were obtained for laboratory studies. The patient tolerated the procedure well and there were no apparent complications. IMPRESSION: Successful floor directed lumbar puncture. Electronically Signed   By: Marcello Moores  Register   On: 12/11/2015 12:40      Management plans discussed with the patient's husbandand he is in agreement. Stable for discharge [place]  Patient should follow  up with HOSPICE  CODE STATUS:     Code Status Orders        Start     Ordered   12/10/15 1606  Do not attempt resuscitation (DNR)   Continuous    Question Answer Comment  In the event of cardiac or respiratory ARREST Do not call a "code blue"   In the event of cardiac or respiratory ARREST Do not perform Intubation, CPR, defibrillation or ACLS   In the event of cardiac or respiratory ARREST Use medication by any route, position, wound care, and other measures to relive pain and suffering. May use oxygen, suction and manual treatment of airway obstruction as needed for comfort.      12/10/15 1606    Code Status History    Date Active Date Inactive Code Status Order ID Comments User Context   This patient has a current code status but no historical code status.    Advance Directive Documentation        Most Recent Value   Type of Advance Directive  Out of facility DNR (pink MOST or yellow form)   Pre-existing out of facility DNR order (yellow form or pink MOST form)  Yellow form placed in chart (order not valid for inpatient use)   "MOST" Form in Place?        TOTAL TIME TAKING CARE OF THIS PATIENT: 35 minutes.    Note: This dictation was prepared with Dragon dictation along with smaller phrase technology. Any  transcriptional errors that result from this process are unintentional.  Beverlee Wilmarth M.D on 12/12/2015 at 12:39 PM  Between 7am to 6pm - Pager - [phone] After 6pm go to [email or web address] - password [email or web address] [facility]  [place] [facility]  Office  [phone]  CC: Primary care physician; Marco Collie, MD

## 2015-12-12 NOTE — Care Management (Signed)
patient's husband would like patient to transfer to [facility].  Liaison is coordinating the  transfer.Updated  [facility]

## 2015-12-12 NOTE — Progress Notes (Signed)
[place] at [place] NAME: Nancy Zuniga    MR#:  [id number]  DATE OF BIRTH:  1956-09-06  SUBJECTIVE:     REVIEW OF SYSTEMS:    Review of Systems  Unable to perform ROS   Tolerating Diet: Nothing by mouth      DRUG ALLERGIES:   Allergies  Allergen Reactions  . Ciprofloxacin Hives  . Codeine Other (See Comments)    Reaction: hyperactivity.  . Morphine And Related Other (See Comments)    Reaction: hyperactivity  . Tape Other (See Comments)    Reaction: blisters if left on too long.  . Iodine Rash    VITALS:  Blood pressure 97/82, pulse 107, temperature 100.5 F (38.1 C), temperature source Rectal, resp. rate 23, height 5\' 5"  (1.651 m), weight 79.697 kg (175 lb 11.2 oz), SpO2 99 %.  PHYSICAL EXAMINATION:   Physical Exam  Constitutional: She is well-developed, well-nourished, and in no distress. No distress.  HENT:  Head: Normocephalic.  Eyes: No scleral icterus.  Neck: Normal range of motion. Neck supple. No JVD present. No tracheal deviation present.  Cardiovascular: Normal rate, regular rhythm and normal heart sounds.  Exam reveals no gallop and no friction rub.   No murmur heard. Pulmonary/Chest: Effort normal and breath sounds normal. No respiratory distress. She has no wheezes. She has no rales. She exhibits no tenderness.  Abdominal: Soft. Bowel sounds are normal. She exhibits no distension and no mass. There is no tenderness. There is no rebound and no guarding.  Musculoskeletal: Normal range of motion. She exhibits no edema.  Neurological:  Rigid UE  Does not follow commands Unresponsive  Skin: Skin is warm. No rash noted. No erythema.      LABORATORY PANEL:   CBC  Recent Labs Lab 12/10/15 1625  WBC 29.5*  HGB 15.0  HCT 45.3  PLT 430   ------------------------------------------------------------------------------------------------------------------  Chemistries   Recent Labs Lab  12/10/15 0938 12/10/15 1625  NA 146*  --   K 4.0  --   CL 111  --   CO2 18*  --   GLUCOSE 288*  --   BUN 37*  --   CREATININE 1.11* 1.27*  CALCIUM 9.9  --   AST 158*  --   ALT 66*  --   ALKPHOS 76  --   BILITOT 0.9  --    ------------------------------------------------------------------------------------------------------------------  Cardiac Enzymes No results for input(s): TROPONINI in the last 168 hours. ------------------------------------------------------------------------------------------------------------------  RADIOLOGY:  Dg Fluoro Guided Loc Of Needle/cath Tip For Spinal Inject Lt  12/11/2015  CLINICAL DATA:  Confusion. EXAM: DIAGNOSTIC LUMBAR PUNCTURE UNDER FLUOROSCOPIC GUIDANCE FLUOROSCOPY TIME:  Radiation Exposure Index (as provided by the fluoroscopic device): 32.4 mGy PROCEDURE: Informed consent was obtained from the patient prior to the procedure, including potential complications of headache, allergy, and pain. With the patient prone, the lower back was prepped with Betadine. 1% Lidocaine was used for local anesthesia. Lumbar puncture was performed at the L4-L5 level using a 22 gauge needle with return of clear CSF. 10 ml of CSF were obtained for laboratory studies. The patient tolerated the procedure well and there were no apparent complications. IMPRESSION: Successful floor directed lumbar puncture. Electronically Signed   By: Marcello Moores  Register   On: 12/11/2015 12:40     ASSESSMENT AND PLAN:   60 year old female with history of bladder cancer status post urostomy, breast cancer with liver metastatic disease recently admitted to [facility] with altered mental status and increased  frequency of falls here with sepsis and ongoing altered mental status.  1. Sepsis: Patient presented with tachycardia, leukocytosis and fever. Urine culture greater than 100,000 colonies, blood cultures are negative to date Influenza negative She underwent LP 2/21 Chest x-ray shows  pneumonitis broad-spectrum antibiotics with Zosyn and vancomycin.   2. Altered mental status with acute encephalopathy after discussing patient's prognosis with the husband, patient will be comfort care. As a most form that says that she would want to be comfort care.  According to this form we are being aggressive with her care.    3. Diabetes: comfort care  4. Essential hypertension: Comfort care  5. History of breast cancer with liver metastases: Comfort care  6. History of bladder cancer status post urostomy.  7. Petechiae lower extremity: Comfort care  8. Acute hypoxic respiratory failure: Likely due to pneumonitis.Nasal cannula for comfort care   CODE STATUS: DNR  TOTAL TIME TAKING CARE OF THIS PATIENT: 40 minutes.  D/w husband Comfort care orders written. Case management referral for hospice.  POSSIBLE D/C ?? DEPENDING ON CLINICAL CONDITION.   Bradie Lacock M.D on 12/12/2015 at 12:07 PM  Between 7am to 6pm - Pager - [phone] After 6pm go to [email or web address] - password [email or web address] [place] [facility]  Office  [phone]  CC: Primary care physician; Marco Collie, MD  Note: This dictation was prepared with Dragon dictation along with smaller phrase technology. Any transcriptional errors that result from this process are unintentional.

## 2015-12-12 NOTE — Progress Notes (Signed)
New hospice home referral received from [facility]. Nancy Zuniga is a 60 year old woman with known history  metastatic breat cancer, DM II, anxiety chronic pain/fibromyalgia admitted to [facility] on 2/20 for evaluation of increased confusion/falls/sepsis. She has received IV antibiotics, had a lumbar puncture, head CT, blood cultures and urinalysis, She was initially on Bipap, now on 5 liters nasal cannula. She has continued to decline with increased fever, increased blood pressure and her husband has chosen to pursue comfort and end of life care at the hospice home. Writer spoke on the phone to Mr. Seqouia Catrambone, patient's husband, he voiced his choice to have his wife transfer to the hospice home, he will go directly to the hospice home to sign consents as he is currently in [place].   Patient seen lying in bed, eyes open, she appears to recognize her name, she is nonverbal with fine tremors to her legs and appears hypersensitive to touch. She does not follow commands. Writer spoke with staff RN Margreta Journey regarding pain medication, patient to receive 2 mg of IV morphine as ordered, she will  plan to premedicate patient prior to transport to the hospice home. Patient information faxed to referral intake. Hospital care team all aware of and in agreement with plan for patient to discharge via EMS with portable DNR in place today. Report called to hospice home, EMS notified for transport. Thank you. Flo Shanks RN, BSN, [place] [facility] [place], [facility] [phone] c

## 2015-12-12 NOTE — Progress Notes (Signed)
Pt sent to [facility] via EMS. Left FA 20 gauge IV left per Hospice request. 22 gauge right hand removed. Catheter intact. 2nd 22 gauge right hand removed. Catheter intact. Belonging sent with patient. France Lusty E 4:37 PM 12/12/2015

## 2015-12-12 NOTE — Consult Note (Signed)
  Psychiatry: Follow-up on this 60 year old woman. I was unable to do much assessment on her yesterday because of her illness making it impossible for her to communicate. Came by to see patient again today. Spoke with nursing on duty. Patient is not able to communicate at all today. Plan is for her to be transferred to hospice. I am going to sign off at this point. If any further assistance is needed please call me or reconsult.

## 2015-12-12 NOTE — Progress Notes (Signed)
Subjective: Patient remains poorly responsive.  No longer on BiPAP.  Remains febrile but rigors improved.  Husband in the room.    Objective: Current vital signs: BP 97/82 mmHg  Pulse 107  Temp(Src) 100.5 F (38.1 C) (Rectal)  Resp 23  Ht 5\' 5"  (1.651 m)  Wt 79.697 kg (175 lb 11.2 oz)  BMI 29.24 kg/m2  SpO2 99% Vital signs in last 24 hours: Temp:  [98.5 F (36.9 C)-102.8 F (39.3 C)] 100.5 F (38.1 C) (02/22 1000) Pulse Rate:  [93-142] 107 (02/22 1000) Resp:  [16-34] 23 (02/22 1000) BP: (97-165)/(72-134) 97/82 mmHg (02/22 1000) SpO2:  [97 %-100 %] 99 % (02/22 1000)  Intake/Output from previous day: 02/21 0701 - 02/22 0700 In: 787.2 [I.V.:3; IV Piggyback:784.2] Out: 1625 [Urine:1625] Intake/Output this shift:   Nutritional status:    Neurologic Exam: Mental Status: Lethargic and difficult to arouse. When aroused moans with no intelligent speech noted. Does not follow commands.  Cranial Nerves: II: Discs flat bilaterally; Blinks to bilateral confrontation, pupils equal, round, reactive to light and accommodation III,IV, VI: ptosis not present, right gaze preference. Able to achieve full excursion with oculocephalic maneuvers V,VII: grimace symmetric, facial light touch sensation normal bilaterally VIII: hearing normal bilaterally IX,X: gag reflex present XI: bilateral shoulder shrug XII: unable to test Motor: Arms extended and rigors only noted when she is stimulated. Withdraws lower extremities Sensory: Responds to noxious stimuli throughout   Lab Results: Basic Metabolic Panel:  Recent Labs Lab 12/10/15 0938 12/10/15 1625  NA 146*  --   K 4.0  --   CL 111  --   CO2 18*  --   GLUCOSE 288*  --   BUN 37*  --   CREATININE 1.11* 1.27*  CALCIUM 9.9  --     Liver Function Tests:  Recent Labs Lab 12/10/15 0938  AST 158*  ALT 66*  ALKPHOS 76  BILITOT 0.9  PROT 8.2*  ALBUMIN 4.6   No results for input(s): LIPASE, AMYLASE in the last 168  hours.  Recent Labs Lab 12/10/15 1010 12/10/15 1625  AMMONIA 31 37*    CBC:  Recent Labs Lab 12/10/15 0938 12/10/15 1625  WBC 21.2* 29.5*  HGB 12.9 15.0  HCT 38.1 45.3  MCV 84.1 85.4  PLT 390 430    Cardiac Enzymes: No results for input(s): CKTOTAL, CKMB, CKMBINDEX, TROPONINI in the last 168 hours.  Lipid Panel: No results for input(s): CHOL, TRIG, HDL, CHOLHDL, VLDL, LDLCALC in the last 168 hours.  CBG:  Recent Labs Lab 12/11/15 1955 12/12/15 0042 12/12/15 0431 12/12/15 0734 12/12/15 1132  GLUCAP 290* 294* 313* 312* 257*    Microbiology: Results for orders placed or performed during the hospital encounter of 12/10/15  Blood culture (routine x 2)     Status: None (Preliminary result)   Collection Time: 12/10/15  9:38 AM  Result Value Ref Range Status   Specimen Description BLOOD LEFT FA  Final   Special Requests   Final    BOTTLES DRAWN AEROBIC AND ANAEROBIC ANA 4ML AER 5ML   Culture NO GROWTH 2 DAYS  Final   Report Status PENDING  Incomplete  Blood culture (routine x 2)     Status: None (Preliminary result)   Collection Time: 12/10/15  9:38 AM  Result Value Ref Range Status   Specimen Description BLOOD RIGHT HAND  Final   Special Requests   Final    BOTTLES DRAWN AEROBIC AND ANAEROBIC ANA 3ML AER 5ML   Culture NO GROWTH  2 DAYS  Final   Report Status PENDING  Incomplete  Urine culture     Status: None (Preliminary result)   Collection Time: 12/10/15 10:10 AM  Result Value Ref Range Status   Specimen Description URINE, RANDOM  Final   Special Requests NONE  Final   Culture   Final    >=100,000 COLONIES/mL UNIDENTIFIED ORGANISM IDENTIFICATION TO FOLLOW    Report Status PENDING  Incomplete  Rapid Influenza A&B Antigens ([facility] only)     Status: None   Collection Time: 12/10/15 10:10 AM  Result Value Ref Range Status   Influenza A ([facility]) NEGATIVE  Final   Influenza B ([facility]) NEGATIVE  Final  CSF culture     Status: None (Preliminary result)    Collection Time: 12/11/15 11:49 AM  Result Value Ref Range Status   Specimen Description CSF  Final   Special Requests NONE  Final   Gram Stain   Final    NO ORGANISMS SEEN FEW WBC SEEN MANY RED BLOOD CELLS    Culture NO GROWTH < 24 HOURS  Final   Report Status PENDING  Incomplete  Culture, fungus without smear ([facility]-Only)     Status: None (Preliminary result)   Collection Time: 12/11/15 11:49 AM  Result Value Ref Range Status   Specimen Description CSF  Final   Special Requests NONE  Final   Culture NO GROWTH < 24 HOURS  Final   Report Status PENDING  Incomplete    Coagulation Studies:  Recent Labs  12/11/15 1001  LABPROT 19.0*  INR 1.59    Imaging: Dg Fluoro Guided Loc Of Needle/cath Tip For Spinal Inject Lt  12/11/2015  CLINICAL DATA:  Confusion. EXAM: DIAGNOSTIC LUMBAR PUNCTURE UNDER FLUOROSCOPIC GUIDANCE FLUOROSCOPY TIME:  Radiation Exposure Index (as provided by the fluoroscopic device): 32.4 mGy PROCEDURE: Informed consent was obtained from the patient prior to the procedure, including potential complications of headache, allergy, and pain. With the patient prone, the lower back was prepped with Betadine. 1% Lidocaine was used for local anesthesia. Lumbar puncture was performed at the L4-L5 level using a 22 gauge needle with return of clear CSF. 10 ml of CSF were obtained for laboratory studies. The patient tolerated the procedure well and there were no apparent complications. IMPRESSION: Successful floor directed lumbar puncture. Electronically Signed   By: Marcello Moores  Register   On: 12/11/2015 12:40    Medications:  I have reviewed the patient's current medications. Scheduled: . antiseptic oral rinse  7 mL Mouth Rinse q12n4p  . chlorhexidine  15 mL Mouth Rinse BID  . LORazepam  1 mg Intravenous BID  . ondansetron  4 mg Oral 3 times per day    Assessment/Plan: Patient remains altered.  LP performed on yesterday shows 38r, 6w.  Glucose 182.  Protein 47.  Preliminary  cultures and gram stain were unremarkable.  Cytology sent as well.  IM had discussion wit husband today and the patient was made comfort care as what appeared to be her previous wishes.  No further neurologic intervention is recommended at this time.  If further questions arise, please call or page at that time.  Thank you for allowing neurology to participate in the care of this patient.    LOS: 2 days   Alexis Goodell, MD Neurology [phone] 12/12/2015  2:53 PM

## 2015-12-12 NOTE — Progress Notes (Signed)
Patient's fever trending upward despite being on cooling blanket and Tylenol suppository given.  Tmax 102.4.  Discussed concerns with Dr. Jannifer Franklin.  Orders received for Tordol 30mg  IV once.

## 2015-12-12 NOTE — Clinical Social Work Note (Signed)
Patient to discharge to the hospice home today. Arrangements made by Santiago Glad with [place]. Shela Leff MSW,LCSW [phone]

## 2015-12-13 LAB — OLIGOCLONAL BANDS, CSF + SERM

## 2015-12-13 LAB — VDRL, CSF: VDRL Quant, CSF: NONREACTIVE

## 2015-12-13 LAB — URINE CULTURE

## 2015-12-14 LAB — CSF CULTURE: GRAM STAIN: NONE SEEN

## 2015-12-14 LAB — CSF CULTURE W GRAM STAIN: Culture: NO GROWTH

## 2015-12-15 LAB — CULTURE, BLOOD (ROUTINE X 2)
CULTURE: NO GROWTH
Culture: NO GROWTH

## 2015-12-19 DEATH — deceased

## 2015-12-20 ENCOUNTER — Ambulatory Visit: Admitting: Pain Medicine

## 2016-01-01 LAB — CULTURE, FUNGUS WITHOUT SMEAR

## 2016-02-05 ENCOUNTER — Ambulatory Visit: Payer: Self-pay | Admitting: Psychiatry

## 2017-09-30 IMAGING — CT CT HEAD W/O CM
3 series · 16 of 30 positions shown, 18 images · non-contrast
Comparison: None.

CLINICAL DATA: Fall this morning with head injury. Progressive
altered mental status and frequent falls. Metastatic breast
carcinoma.

EXAM:
CT HEAD WITHOUT CONTRAST
TECHNIQUE: Contiguous axial images were obtained from the base of the skull
through the vertex without intravenous contrast.

[Series 2: head wo · axial · 0.43mm/px · z∈[+525,+630]mm · 5 of 33 slices shown, 7 images (1 of 2)]
[im 6/33  brain]
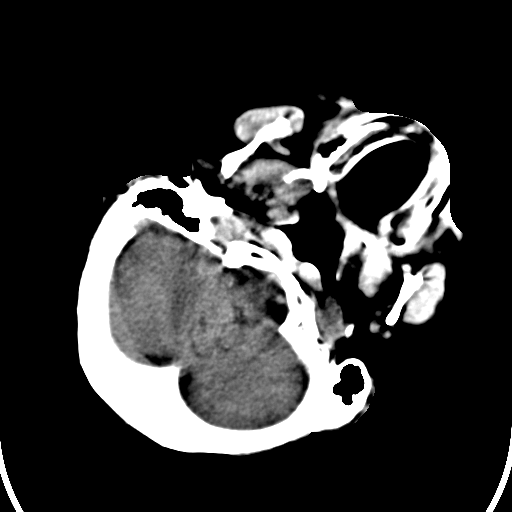
[im 6/33  bone]
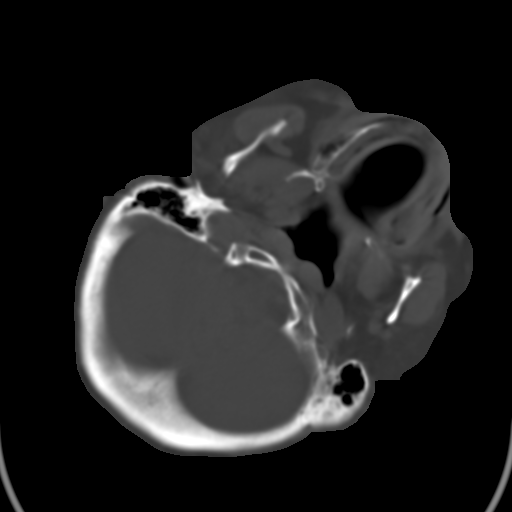
[im 11/33  brain]
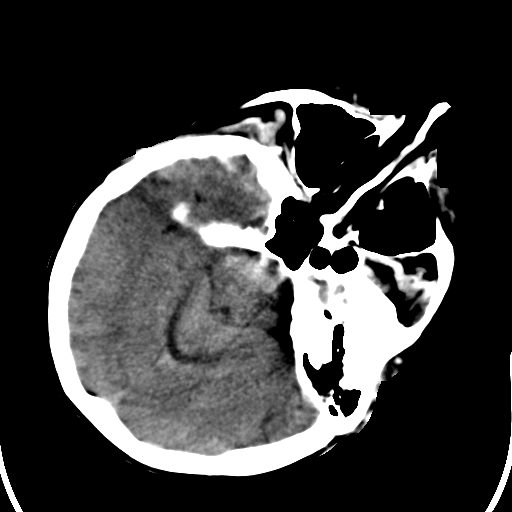
[im 17/33  brain]
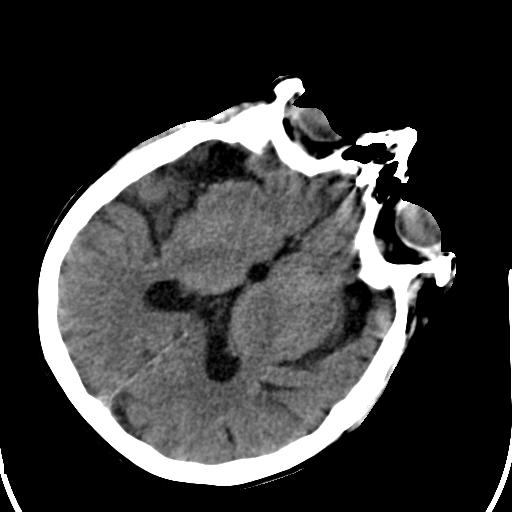
[im 22/33  brain]
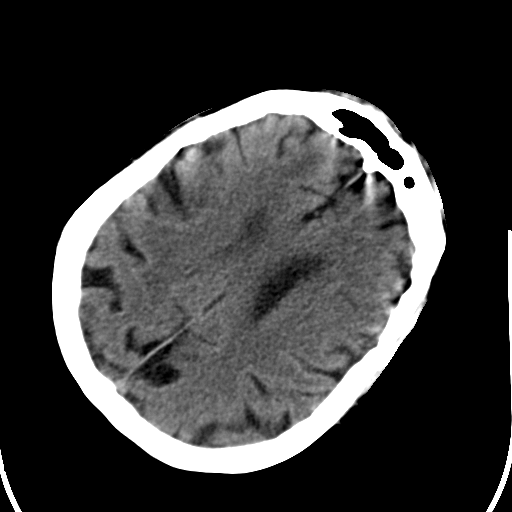
[im 27/33  brain]
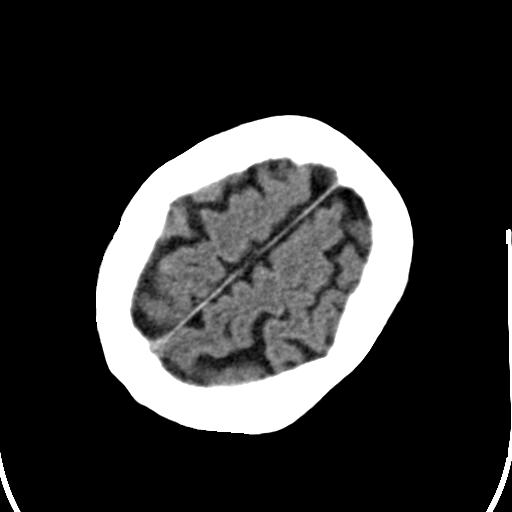
[im 27/33  bone]
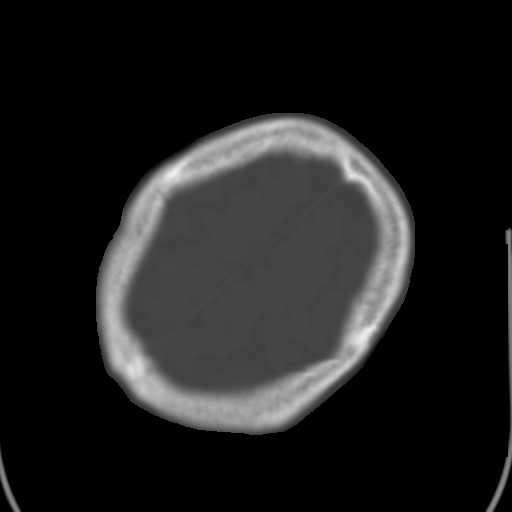

[Series 4: head wo · axial · 0.38mm/px · z∈[+570,+664]mm · 5 of 31 slices shown (2 of 2)]
[im 6/31  brain]
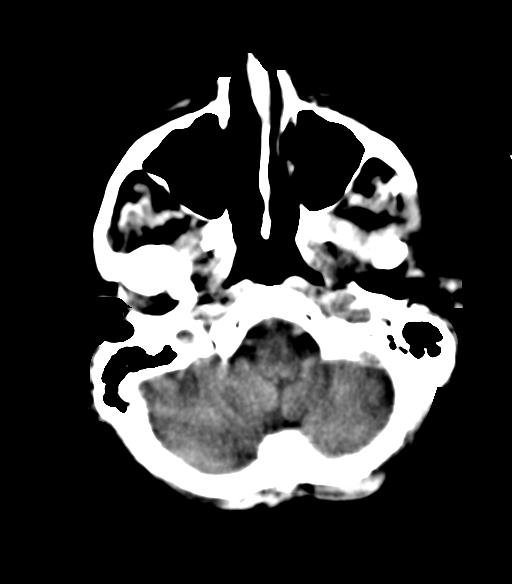
[im 11/31  brain]
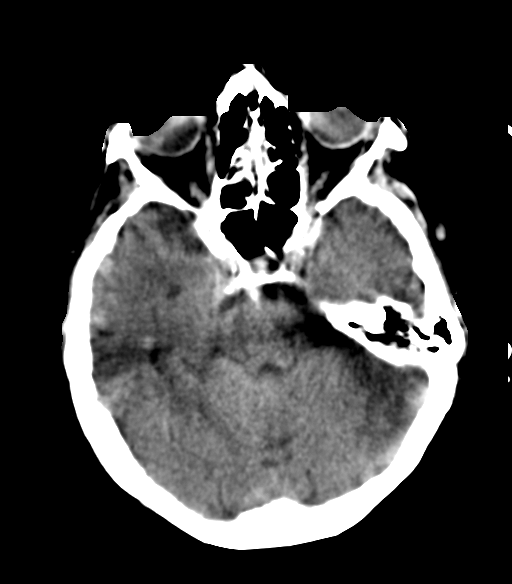
[im 16/31  brain]
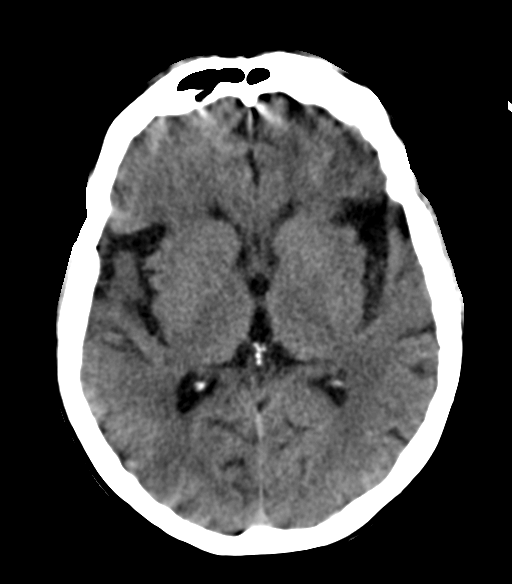
[im 21/31  brain]
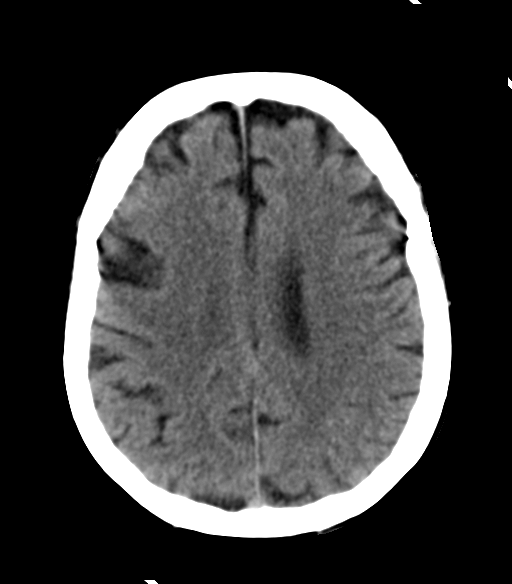
[im 26/31  brain]
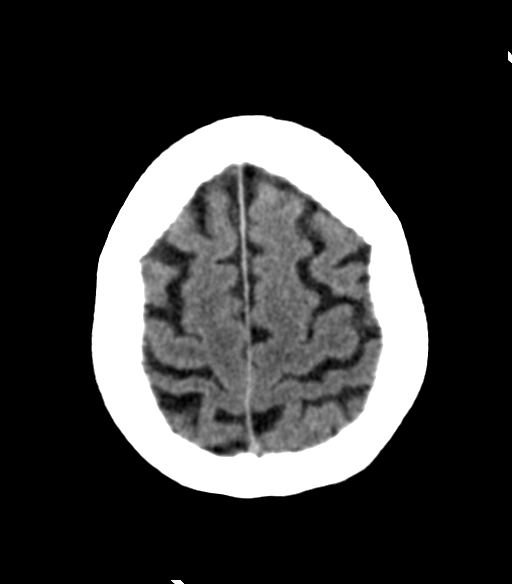

[Series 5: head bone 2 · axial · 0.37mm/px · z∈[+565,+648]mm · 6 of 75 slices shown]
[im 5/75  bone]
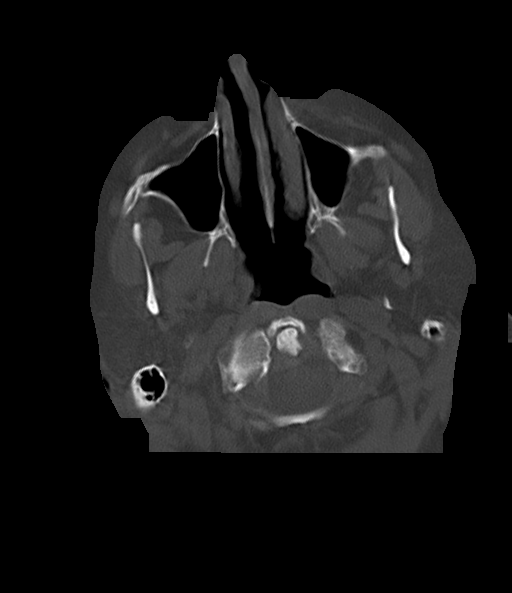
[im 15/75  bone]
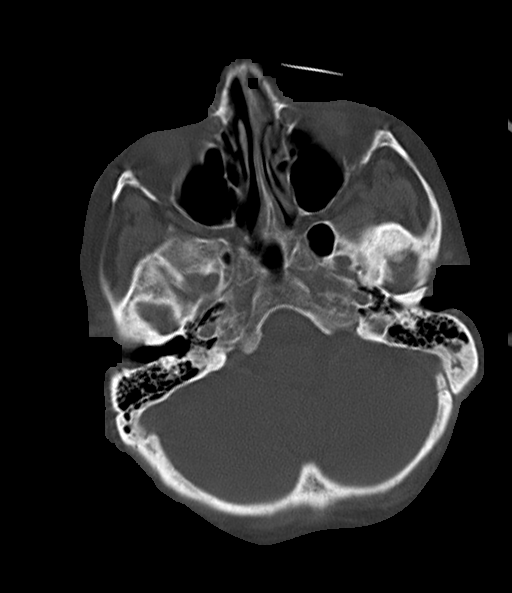
[im 25/75  bone]
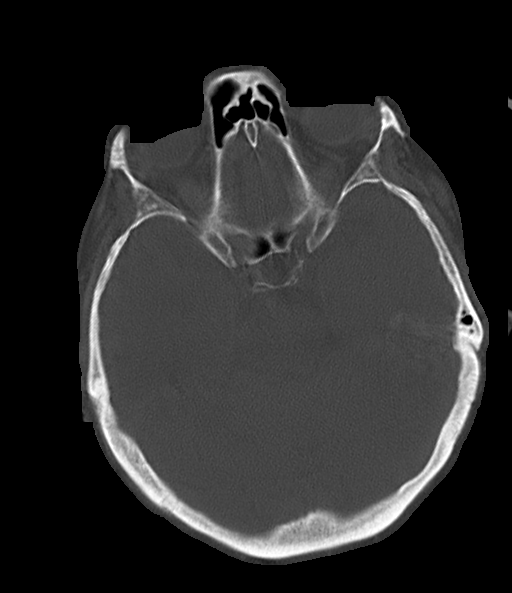
[im 35/75  bone]
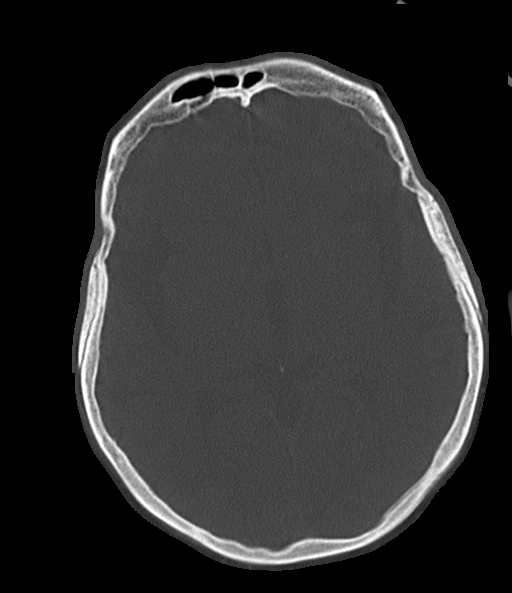
[im 40/75  bone]
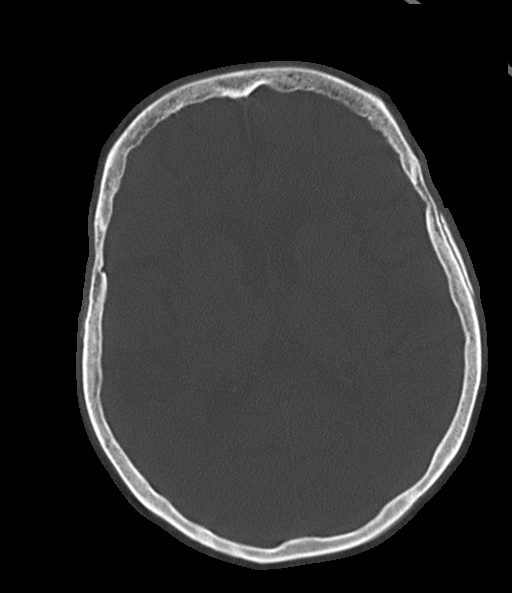
[im 50/75  bone]
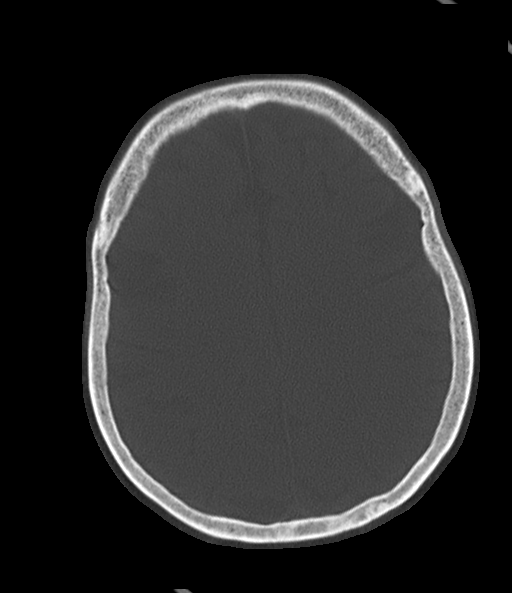

[16 of 30 positions shown; findings below may reference images not displayed]

FINDINGS: There is no evidence of intracranial hemorrhage, brain edema, or
other signs of acute infarction. There is no evidence of
intracranial mass lesion or mass effect. No abnormal extraaxial
fluid collections are identified. Mild cerebral atrophy is noted. No
evidence of hydrocephalus. No evidence skull fracture or
pneumocephalus.
IMPRESSION: No acute intracranial abnormality.

Mild cerebral atrophy.
